# Patient Record
Sex: Female | Born: 1970
Health system: Southern US, Community
[De-identification: ages and names within clinical notes are randomized; demographics above are authoritative.]

## PROBLEM LIST (undated history)

## (undated) DIAGNOSIS — J45909 Unspecified asthma, uncomplicated: Secondary | ICD-10-CM

## (undated) DIAGNOSIS — K219 Gastro-esophageal reflux disease without esophagitis: Secondary | ICD-10-CM

## (undated) DIAGNOSIS — E119 Type 2 diabetes mellitus without complications: Secondary | ICD-10-CM

## (undated) DIAGNOSIS — E559 Vitamin D deficiency, unspecified: Secondary | ICD-10-CM

## (undated) DIAGNOSIS — J3089 Other allergic rhinitis: Secondary | ICD-10-CM

## (undated) DIAGNOSIS — I10 Essential (primary) hypertension: Secondary | ICD-10-CM

## (undated) HISTORY — PX: TUBAL LIGATION: SHX77

## (undated) HISTORY — DX: Gastro-esophageal reflux disease without esophagitis: K21.9

## (undated) HISTORY — DX: Vitamin D deficiency, unspecified: E55.9

## (undated) HISTORY — PX: BACK SURGERY: SHX140

## (undated) HISTORY — PX: APPENDECTOMY: SHX54

---

## 1998-11-21 ENCOUNTER — Other Ambulatory Visit: Admission: RE | Admit: 1998-11-21 | Discharge: 1998-11-21 | Payer: Self-pay | Admitting: Family Medicine

## 1998-11-26 ENCOUNTER — Ambulatory Visit (HOSPITAL_COMMUNITY): Admission: RE | Admit: 1998-11-26 | Discharge: 1998-11-26 | Payer: Self-pay | Admitting: Family Medicine

## 2000-10-16 ENCOUNTER — Encounter: Payer: Self-pay | Admitting: *Deleted

## 2000-10-16 ENCOUNTER — Ambulatory Visit (HOSPITAL_COMMUNITY): Admission: RE | Admit: 2000-10-16 | Discharge: 2000-10-16 | Payer: Self-pay | Admitting: *Deleted

## 2002-02-04 ENCOUNTER — Encounter: Payer: Self-pay | Admitting: Family Medicine

## 2002-02-04 ENCOUNTER — Ambulatory Visit (HOSPITAL_COMMUNITY): Admission: RE | Admit: 2002-02-04 | Discharge: 2002-02-04 | Payer: Self-pay | Admitting: Family Medicine

## 2003-09-26 ENCOUNTER — Ambulatory Visit (HOSPITAL_COMMUNITY): Admission: RE | Admit: 2003-09-26 | Discharge: 2003-09-26 | Payer: Self-pay | Admitting: Family Medicine

## 2004-03-07 ENCOUNTER — Encounter (INDEPENDENT_AMBULATORY_CARE_PROVIDER_SITE_OTHER): Payer: Self-pay | Admitting: Specialist

## 2004-03-07 ENCOUNTER — Observation Stay (HOSPITAL_COMMUNITY): Admission: EM | Admit: 2004-03-07 | Discharge: 2004-03-08 | Payer: Self-pay | Admitting: Emergency Medicine

## 2004-06-17 ENCOUNTER — Other Ambulatory Visit: Admission: RE | Admit: 2004-06-17 | Discharge: 2004-06-17 | Payer: Self-pay | Admitting: Family Medicine

## 2005-06-20 ENCOUNTER — Other Ambulatory Visit: Admission: RE | Admit: 2005-06-20 | Discharge: 2005-06-20 | Payer: Self-pay | Admitting: Family Medicine

## 2006-07-24 ENCOUNTER — Other Ambulatory Visit: Admission: RE | Admit: 2006-07-24 | Discharge: 2006-07-24 | Payer: Self-pay | Admitting: Family Medicine

## 2007-01-07 HISTORY — PX: LUMBAR DISC SURGERY: SHX700

## 2007-09-17 ENCOUNTER — Other Ambulatory Visit: Admission: RE | Admit: 2007-09-17 | Discharge: 2007-09-17 | Payer: Self-pay | Admitting: Family Medicine

## 2010-05-24 NOTE — H&P (Signed)
NAMECAILIE, BOSSHART         ACCOUNT NO.:  [id number]   MEDICAL RECORD NO.:  [id number]          PATIENT TYPE:  INP   LOCATION:  0101                         FACILITY[facility]  [facility]   PHYSICIAN:  Vikki Ports, MDDATE OF BIRTH:  01/19/1970   DATE OF ADMISSION:  03/07/2004  DATE OF DISCHARGE:                                HISTORY & PHYSICAL   ADMISSION DIAGNOSES:  Acute appendicitis.   HISTORY OF PRESENT ILLNESS:  The patient is a very pleasant 40 year old  white female who noticed periumbilical abdominal pain 24 hours prior to  presenting to the emergency room. It then localized in the right lower  quadrant and she did experience some nausea. She was seen by the emergency  room physician who ordered a CAT scan. CT was consistent with acute  appendicitis and white count was elevated at 16,000.  I was then consulted.   PAST MEDICAL HISTORY:  Significant for asthma.   PAST SURGICAL HISTORY:  Significant for bilateral tubal ligation.   MEDICATIONS:  Flonase and albuterol.   PHYSICAL EXAMINATION:  GENERAL:  She is an age appropriate, obese, white  female in no distress.  HEENT:  Benign, normocephalic, atraumatic. Pupils equal round and reactive  to light.  NECK:  Soft without thyromegaly or cervical adenopathy.  LUNGS:  Clear to auscultation and percussion x2.  HEART:  Regular rate and rhythm without murmurs, rubs or gallops.  ABDOMEN:  Obese but tender in the right lower quadrant. There are no  abdominal wall hernia defects.   IMPRESSION:  Acute appendicitis.   PLAN:  Laparoscopic appendectomy with preoperative antibiotics.      KRH/MEDQ  D:  03/07/2004  T:  03/07/2004  Job:  [id number]

## 2010-09-06 ENCOUNTER — Other Ambulatory Visit: Payer: Self-pay | Admitting: Family Medicine

## 2010-09-06 ENCOUNTER — Other Ambulatory Visit (HOSPITAL_COMMUNITY)
Admission: RE | Admit: 2010-09-06 | Discharge: 2010-09-06 | Disposition: A | Discharge status: Home / Self Care | Payer: PRIVATE HEALTH INSURANCE | Source: Ambulatory Visit | Attending: Family Medicine | Admitting: Family Medicine

## 2010-09-06 DIAGNOSIS — Z0000 Encounter for general adult medical examination without abnormal findings: Secondary | ICD-10-CM | POA: Insufficient documentation

## 2011-08-22 ENCOUNTER — Other Ambulatory Visit: Payer: Self-pay | Admitting: *Deleted

## 2013-01-14 ENCOUNTER — Other Ambulatory Visit (HOSPITAL_COMMUNITY)
Admission: RE | Admit: 2013-01-14 | Discharge: 2013-01-14 | Disposition: A | Discharge status: Home / Self Care | Payer: BC Managed Care – PPO | Source: Ambulatory Visit | Attending: Family Medicine | Admitting: Family Medicine

## 2013-01-14 ENCOUNTER — Other Ambulatory Visit: Payer: Self-pay | Admitting: Family Medicine

## 2013-01-14 DIAGNOSIS — Z0000 Encounter for general adult medical examination without abnormal findings: Secondary | ICD-10-CM | POA: Insufficient documentation

## 2013-02-15 ENCOUNTER — Other Ambulatory Visit: Payer: Self-pay

## 2013-02-15 DIAGNOSIS — Z1231 Encounter for screening mammogram for malignant neoplasm of breast: Secondary | ICD-10-CM

## 2013-02-25 ENCOUNTER — Ambulatory Visit
Admission: RE | Admit: 2013-02-25 | Discharge: 2013-02-25 | Disposition: A | Discharge status: Home / Self Care | Payer: BC Managed Care – PPO | Source: Ambulatory Visit

## 2013-02-25 DIAGNOSIS — Z1231 Encounter for screening mammogram for malignant neoplasm of breast: Secondary | ICD-10-CM

## 2013-03-01 ENCOUNTER — Other Ambulatory Visit: Payer: Self-pay | Admitting: Family Medicine

## 2013-03-01 DIAGNOSIS — R928 Other abnormal and inconclusive findings on diagnostic imaging of breast: Secondary | ICD-10-CM

## 2013-03-16 ENCOUNTER — Ambulatory Visit
Admission: RE | Admit: 2013-03-16 | Discharge: 2013-03-16 | Disposition: A | Discharge status: Home / Self Care | Payer: BC Managed Care – PPO | Source: Ambulatory Visit | Attending: Family Medicine | Admitting: Family Medicine

## 2013-03-16 ENCOUNTER — Ambulatory Visit
Admission: RE | Admit: 2013-03-16 | Discharge: 2013-03-16 | Disposition: A | Discharge status: Home / Self Care | Payer: Self-pay | Source: Ambulatory Visit | Attending: Family Medicine | Admitting: Family Medicine

## 2013-03-16 DIAGNOSIS — R928 Other abnormal and inconclusive findings on diagnostic imaging of breast: Secondary | ICD-10-CM

## 2013-09-06 ENCOUNTER — Other Ambulatory Visit: Payer: Self-pay | Admitting: Family Medicine

## 2013-09-06 DIAGNOSIS — N63 Unspecified lump in unspecified breast: Secondary | ICD-10-CM

## 2013-09-19 ENCOUNTER — Encounter (INDEPENDENT_AMBULATORY_CARE_PROVIDER_SITE_OTHER): Payer: Self-pay

## 2013-09-19 ENCOUNTER — Ambulatory Visit
Admission: RE | Admit: 2013-09-19 | Discharge: 2013-09-19 | Disposition: A | Discharge status: Home / Self Care | Payer: BC Managed Care – PPO | Source: Ambulatory Visit | Attending: Family Medicine | Admitting: Family Medicine

## 2013-09-19 DIAGNOSIS — N63 Unspecified lump in unspecified breast: Secondary | ICD-10-CM

## 2014-03-06 ENCOUNTER — Other Ambulatory Visit: Payer: Self-pay | Admitting: Family Medicine

## 2014-03-06 DIAGNOSIS — R921 Mammographic calcification found on diagnostic imaging of breast: Secondary | ICD-10-CM

## 2014-03-24 ENCOUNTER — Ambulatory Visit
Admission: RE | Admit: 2014-03-24 | Discharge: 2014-03-24 | Disposition: A | Discharge status: Home / Self Care | Payer: BLUE CROSS/BLUE SHIELD | Source: Ambulatory Visit | Attending: Family Medicine | Admitting: Family Medicine

## 2014-03-24 DIAGNOSIS — R921 Mammographic calcification found on diagnostic imaging of breast: Secondary | ICD-10-CM

## 2015-06-29 ENCOUNTER — Other Ambulatory Visit (HOSPITAL_COMMUNITY)
Admission: RE | Admit: 2015-06-29 | Discharge: 2015-06-29 | Disposition: A | Discharge status: Home / Self Care | Payer: BLUE CROSS/BLUE SHIELD | Source: Ambulatory Visit | Attending: Family Medicine | Admitting: Family Medicine

## 2015-06-29 ENCOUNTER — Other Ambulatory Visit: Payer: Self-pay | Admitting: Family Medicine

## 2015-06-29 DIAGNOSIS — Z113 Encounter for screening for infections with a predominantly sexual mode of transmission: Secondary | ICD-10-CM | POA: Insufficient documentation

## 2015-06-29 DIAGNOSIS — Z0000 Encounter for general adult medical examination without abnormal findings: Secondary | ICD-10-CM | POA: Diagnosis not present

## 2015-06-29 DIAGNOSIS — Z124 Encounter for screening for malignant neoplasm of cervix: Secondary | ICD-10-CM | POA: Insufficient documentation

## 2015-06-29 DIAGNOSIS — E559 Vitamin D deficiency, unspecified: Secondary | ICD-10-CM | POA: Diagnosis not present

## 2015-06-29 DIAGNOSIS — Z1151 Encounter for screening for human papillomavirus (HPV): Secondary | ICD-10-CM | POA: Diagnosis not present

## 2015-06-29 DIAGNOSIS — Z13220 Encounter for screening for lipoid disorders: Secondary | ICD-10-CM | POA: Diagnosis not present

## 2015-07-02 LAB — CYTOLOGY - PAP

## 2016-06-05 ENCOUNTER — Emergency Department (HOSPITAL_BASED_OUTPATIENT_CLINIC_OR_DEPARTMENT_OTHER)
Admission: EM | Admit: 2016-06-05 | Discharge: 2016-06-06 | Disposition: A | Discharge status: Home / Self Care | Payer: 59 | Attending: Emergency Medicine | Admitting: Emergency Medicine

## 2016-06-05 ENCOUNTER — Emergency Department (HOSPITAL_BASED_OUTPATIENT_CLINIC_OR_DEPARTMENT_OTHER): Payer: 59

## 2016-06-05 ENCOUNTER — Encounter (HOSPITAL_BASED_OUTPATIENT_CLINIC_OR_DEPARTMENT_OTHER): Payer: Self-pay | Admitting: Emergency Medicine

## 2016-06-05 DIAGNOSIS — R0789 Other chest pain: Secondary | ICD-10-CM

## 2016-06-05 DIAGNOSIS — J45909 Unspecified asthma, uncomplicated: Secondary | ICD-10-CM | POA: Insufficient documentation

## 2016-06-05 DIAGNOSIS — Z79899 Other long term (current) drug therapy: Secondary | ICD-10-CM | POA: Insufficient documentation

## 2016-06-05 DIAGNOSIS — R079 Chest pain, unspecified: Secondary | ICD-10-CM | POA: Diagnosis present

## 2016-06-05 HISTORY — DX: Other allergic rhinitis: J30.89

## 2016-06-05 HISTORY — DX: Unspecified asthma, uncomplicated: J45.909

## 2016-06-05 LAB — BASIC METABOLIC PANEL
Anion gap: 8 (ref 5–15)
BUN: 11 mg/dL (ref 6–20)
CHLORIDE: 102 mmol/L (ref 101–111)
CO2: 26 mmol/L (ref 22–32)
Calcium: 8.8 mg/dL — ABNORMAL LOW (ref 8.9–10.3)
Creatinine, Ser: 0.69 mg/dL (ref 0.44–1.00)
GFR calc Af Amer: >60 mL/min (ref >60)
GFR calc non Af Amer: >60 mL/min (ref >60)
GLUCOSE: 168 mg/dL — AB (ref 65–99)
POTASSIUM: 3.4 mmol/L — AB (ref 3.5–5.1)
Sodium: 136 mmol/L (ref 135–145)

## 2016-06-05 LAB — CBC
HCT: 34.6 % — ABNORMAL LOW (ref 36.0–46.0)
Hemoglobin: 11.4 g/dL — ABNORMAL LOW (ref 12.0–15.0)
MCH: 28.4 pg (ref 26.0–34.0)
MCHC: 32.9 g/dL (ref 30.0–36.0)
MCV: 86.1 fL (ref 78.0–100.0)
Platelets: 330 10*3/uL (ref 150–400)
RBC: 4.02 MIL/uL (ref 3.87–5.11)
RDW: 13.4 % (ref 11.5–15.5)
WBC: 8.5 10*3/uL (ref 4.0–10.5)

## 2016-06-05 LAB — TROPONIN I: Troponin I: <0.03 ng/mL (ref <0.03)

## 2016-06-05 LAB — D-DIMER, QUANTITATIVE: D-Dimer, Quant: 0.44 ug/mL-FEU (ref 0.00–0.50)

## 2016-06-05 MED ORDER — IPRATROPIUM-ALBUTEROL 0.5-2.5 (3) MG/3ML IN SOLN
3.0000 mL | Freq: Once | RESPIRATORY_TRACT | Status: AC
  Administered 2016-06-05: 3 mL via RESPIRATORY_TRACT

## 2016-06-05 MED ORDER — ALBUTEROL SULFATE (2.5 MG/3ML) 0.083% IN NEBU
2.5000 mg | INHALATION_SOLUTION | Freq: Once | RESPIRATORY_TRACT | Status: AC
  Administered 2016-06-05: 2.5 mg via RESPIRATORY_TRACT

## 2016-06-05 NOTE — ED Provider Notes (Signed)
[facility] Provider Note   CSN: [id number] Arrival date & time: 06/05/16  2039   By signing my name below, I, [name], attest that this documentation has been prepared under the direction and in the presence of Melburn Hake, [place]. Electronically Signed: [name], Scribe. 06/05/16. 11:21 PM.   History   Chief Complaint Chief Complaint  Patient presents with  . Chest Pain   The history is provided by the patient and medical records. No language interpreter was used.    Karen Mendoza is a 46 y.o. female with h/o asthma and seasonal allergies presenting to the Emergency Department with chief complaint of central chest pain spreading across the chest x 3 days. She describes 2/10, dull, pressure like, non radiaiting pain that becomes sharp and slightly more intense when taking deep breaths. Mild nonproductive cough and wheezing also noted. She adds her pain has improved Since arrival to the ED. She further states she used her inhaler last ~3 Pm today without relief to pain. Pt allegedly takes zyrtec and benadryl for allergies daily. No h/o HTN, HLD, DM or CA. No personal or family history of cardiac disease. No recent surgery, hospitalizations, immobilizations, recent long distance travel or hormone therapies noted. No fever, rhinorrhea, congestion, significant SOB, palpitations, abdominal pain, N/V, numbness, weakness or leg swelling noted. No other complaints at this time.   Past Medical History:  Diagnosis Date  . Asthma   . Environmental and seasonal allergies     There are no active problems to display for this patient.   Past Surgical History:  Procedure Laterality Date  . APPENDECTOMY      OB History    No data available       Home Medications    Prior to Admission medications   Medication Sig Start Date End Date Taking? Authorizing Provider  cetirizine (ZYRTEC) 10 MG tablet Take 10 mg by mouth daily.   Yes [provider]    omeprazole (PRILOSEC) 20 MG capsule Take 20 mg by mouth daily.   Yes [provider]    Family History History reviewed. No pertinent family history.  Social History Social History  Substance Use Topics  . Smoking status: Never Smoker  . Smokeless tobacco: Never Used  . Alcohol use No     Allergies   Indomethacin   Review of Systems Review of Systems  Constitutional: Negative for fever.  HENT: Negative for congestion and rhinorrhea.   Respiratory: Positive for cough, chest tightness and wheezing. Negative for shortness of breath.   Cardiovascular: Positive for chest pain. Negative for palpitations and leg swelling.  Gastrointestinal: Negative for abdominal pain, nausea and vomiting.  Allergic/Immunologic: Positive for environmental allergies. Negative for immunocompromised state.  Neurological: Negative for weakness and numbness.     Physical Exam Updated Vital Signs BP (!) 141/73   Pulse 96   Temp 97.9 F (36.6 C) (Oral)   Resp 18   Ht 5\' 10"  (1.778 m)   Wt 300 lb (136.1 kg)   SpO2 98%   BMI 43.05 kg/m   Physical Exam  Constitutional: She is oriented to person, place, and time. She appears well-developed and well-nourished. No distress.  Morbidly obese female  HENT:  Head: Normocephalic and atraumatic.  Mouth/Throat: Uvula is midline, oropharynx is clear and moist and mucous membranes are normal. No oropharyngeal exudate, posterior oropharyngeal edema, posterior oropharyngeal erythema or tonsillar abscesses. No tonsillar exudate.  Eyes: Conjunctivae and EOM are normal. Pupils are equal, round, and reactive  to light. Right eye exhibits no discharge. Left eye exhibits no discharge. No scleral icterus.  Neck: Normal range of motion. Neck supple.  Cardiovascular: Normal rate, regular rhythm, normal heart sounds and intact distal pulses.   Pulmonary/Chest: Effort normal and breath sounds normal. No respiratory distress. She has no wheezes. She has no rales.  She exhibits no tenderness.  Abdominal: Soft. Bowel sounds are normal. She exhibits no distension and no mass. There is no tenderness. There is no rebound and no guarding. No hernia.  Musculoskeletal: Normal range of motion. She exhibits no edema.  Neurological: She is alert and oriented to person, place, and time.  Skin: Skin is warm and dry. She is not diaphoretic.  Nursing note and vitals reviewed.    ED Treatments / Results  DIAGNOSTIC STUDIES: Oxygen Saturation is 98% on RA, NL by my interpretation.    COORDINATION OF CARE: 10:54 PM-Discussed next steps with pt. Pt verbalized understanding and is agreeable with the plan. Will order EKG.   Labs (all labs ordered are listed, but only abnormal results are displayed) Labs Reviewed  BASIC METABOLIC PANEL - Abnormal; Notable for the following:       Result Value   Potassium 3.4 (*)    Glucose, Bld 168 (*)    Calcium 8.8 (*)    All other components within normal limits  CBC - Abnormal; Notable for the following:    Hemoglobin 11.4 (*)    HCT 34.6 (*)    All other components within normal limits  TROPONIN I  D-DIMER, QUANTITATIVE (NOT AT [facility])    EKG  EKG Interpretation  Date/Time:  Thursday Jun 05 2016 20:52:08 EDT Ventricular Rate:  92 PR Interval:  162 QRS Duration: 104 QT Interval:  368 QTC Calculation: 455 R Axis:   -12 Text Interpretation:  Normal sinus rhythm Left ventricular hypertrophy Cannot rule out Septal infarct , age undetermined Abnormal ECG No prior for comparison.  no STEMI Confirmed by Theda Belfast ([id number]) on 06/05/2016 11:27:28 PM       Radiology Dg Chest 2 View  Result Date: 06/05/2016 CLINICAL DATA:  Shortness of breath with chest pain for 3 days EXAM: CHEST  2 VIEW COMPARISON:  None. FINDINGS: No focal pulmonary infiltrate, consolidation or effusion. Minimal bibasilar atelectasis. Borderline cardiomegaly. No pneumothorax. IMPRESSION: Borderline cardiomegaly with minimal bibasilar atelectasis. No  infiltrate or edema. Electronically Signed   By: Jasmine Pang M.D.   On: 06/05/2016 21:12    Procedures Procedures (including critical care time)  Medications Ordered in ED Medications  albuterol (PROVENTIL) (2.5 MG/3ML) 0.083% nebulizer solution 2.5 mg (2.5 mg Nebulization Given 06/05/16 2108)  ipratropium-albuterol (DUONEB) 0.5-2.5 (3) MG/3ML nebulizer solution 3 mL (3 mLs Nebulization Given 06/05/16 2108)     Initial Impression / Assessment and Plan / ED Course  I have reviewed the triage vital signs and the nursing notes.  Pertinent labs & imaging results that were available during my care of the patient were reviewed by me and considered in my medical decision making (see chart for details).     Patient presents with constant cardiac chest pain has impressive for the past 3 days. Denies fever, shortness of breath. History of asthma. Denies personal or family history of cardiac disease. VSS. Exam unremarkable, lungs clear to auscultation bilaterally. EKG showed sinus rhythm with no acute ischemic changes. Troponin negative. Labs unremarkable. Chest x-ray negative. PERC negative. HEART score 1. Discussed pt with Dr. Rush Landmark. Due to patient with history of pleuritic chest pain will  order d-dimer for further evaluation of chest pain due to concern for PE. D-dimer negative. On reevaluation patient is sitting resting comfortably in bed. I have a low suspicion for ACS, PE, dissection, or other acute cardiac event at this time. Suspect patient's symptoms may be related to her history of asthma. Plan to discharge patient home with symptomatic treatment and advised to continue using her albuterol inhaler at home. Advised patient to follow up with her PCP. Discussed return precautions.    Final Clinical Impressions(s) / ED Diagnoses   Final diagnoses:  Atypical chest pain    New Prescriptions New Prescriptions   No medications on file   I personally performed the services described in this  documentation, which was scribed in my presence. The recorded information has been reviewed and is accurate.    Barrett Henleadeau, Nicole Elizabeth, PA-C 06/06/16 0012    Tegeler, Canary Brimhristopher J, MD 06/06/16 [id number]

## 2016-06-05 NOTE — ED Notes (Signed)
Pt reports chest pain upon breathing for 3 days, denies any radiation of pain. Pt points to the center of her chest as to where the pain is at.

## 2016-06-05 NOTE — ED Notes (Signed)
ED Provider at bedside. 

## 2016-06-05 NOTE — ED Triage Notes (Signed)
Patient states that she had had pain to her chest for the last 2 -3 days. Reports that today it had been steady and she is now having SOB - concerned because she has asthma

## 2016-06-06 NOTE — Discharge Instructions (Signed)
Continue taking her medications as prescribed. I recommend continuing to use her albuterol as needed for shortness of breath or wheezing. Follow-up with your primary care provider in the next 3-4 days for follow-up evaluation regarding your chest pain. Return to the emergency department if symptoms worsen or new onset of fever, dizziness, new/worsening chest pain, difficulty breathing, coughing up blood, abdominal pain, vomiting, numbness, weakness, syncope.

## 2016-07-11 ENCOUNTER — Other Ambulatory Visit: Payer: Self-pay | Admitting: Family Medicine

## 2016-07-11 DIAGNOSIS — Z79899 Other long term (current) drug therapy: Secondary | ICD-10-CM | POA: Diagnosis not present

## 2016-07-11 DIAGNOSIS — N938 Other specified abnormal uterine and vaginal bleeding: Secondary | ICD-10-CM

## 2016-07-11 DIAGNOSIS — R079 Chest pain, unspecified: Secondary | ICD-10-CM | POA: Diagnosis not present

## 2016-07-11 DIAGNOSIS — K219 Gastro-esophageal reflux disease without esophagitis: Secondary | ICD-10-CM | POA: Diagnosis not present

## 2016-07-11 DIAGNOSIS — Z13220 Encounter for screening for lipoid disorders: Secondary | ICD-10-CM | POA: Diagnosis not present

## 2016-07-11 DIAGNOSIS — Z0000 Encounter for general adult medical examination without abnormal findings: Secondary | ICD-10-CM | POA: Diagnosis not present

## 2016-07-11 DIAGNOSIS — E559 Vitamin D deficiency, unspecified: Secondary | ICD-10-CM | POA: Diagnosis not present

## 2016-07-11 DIAGNOSIS — Z1231 Encounter for screening mammogram for malignant neoplasm of breast: Secondary | ICD-10-CM

## 2016-07-18 ENCOUNTER — Ambulatory Visit
Admission: RE | Admit: 2016-07-18 | Discharge: 2016-07-18 | Disposition: A | Discharge status: Home / Self Care | Payer: BLUE CROSS/BLUE SHIELD | Source: Ambulatory Visit | Attending: Family Medicine | Admitting: Family Medicine

## 2016-07-18 DIAGNOSIS — Z1231 Encounter for screening mammogram for malignant neoplasm of breast: Secondary | ICD-10-CM | POA: Diagnosis not present

## 2016-07-25 ENCOUNTER — Ambulatory Visit
Admission: RE | Admit: 2016-07-25 | Discharge: 2016-07-25 | Disposition: A | Discharge status: Home / Self Care | Payer: BLUE CROSS/BLUE SHIELD | Source: Ambulatory Visit | Attending: Family Medicine | Admitting: Family Medicine

## 2016-07-25 DIAGNOSIS — N939 Abnormal uterine and vaginal bleeding, unspecified: Secondary | ICD-10-CM | POA: Diagnosis not present

## 2016-07-25 DIAGNOSIS — N938 Other specified abnormal uterine and vaginal bleeding: Secondary | ICD-10-CM

## 2016-07-31 DIAGNOSIS — J029 Acute pharyngitis, unspecified: Secondary | ICD-10-CM | POA: Diagnosis not present

## 2017-01-30 DIAGNOSIS — L03221 Cellulitis of neck: Secondary | ICD-10-CM | POA: Diagnosis not present

## 2017-02-10 DIAGNOSIS — L03221 Cellulitis of neck: Secondary | ICD-10-CM | POA: Diagnosis not present

## 2017-03-13 DIAGNOSIS — L508 Other urticaria: Secondary | ICD-10-CM | POA: Diagnosis not present

## 2017-03-31 ENCOUNTER — Emergency Department (HOSPITAL_BASED_OUTPATIENT_CLINIC_OR_DEPARTMENT_OTHER): Payer: BLUE CROSS/BLUE SHIELD

## 2017-03-31 ENCOUNTER — Encounter (HOSPITAL_BASED_OUTPATIENT_CLINIC_OR_DEPARTMENT_OTHER): Payer: Self-pay | Admitting: *Deleted

## 2017-03-31 ENCOUNTER — Encounter: Payer: Self-pay | Admitting: Surgery

## 2017-03-31 ENCOUNTER — Observation Stay (HOSPITAL_BASED_OUTPATIENT_CLINIC_OR_DEPARTMENT_OTHER)
Admission: EM | Admit: 2017-03-31 | Discharge: 2017-04-02 | Disposition: A | Discharge status: Home / Self Care | Payer: BLUE CROSS/BLUE SHIELD | Attending: General Surgery | Admitting: General Surgery

## 2017-03-31 ENCOUNTER — Other Ambulatory Visit: Payer: Self-pay

## 2017-03-31 DIAGNOSIS — K8010 Calculus of gallbladder with chronic cholecystitis without obstruction: Secondary | ICD-10-CM | POA: Diagnosis not present

## 2017-03-31 DIAGNOSIS — K8020 Calculus of gallbladder without cholecystitis without obstruction: Secondary | ICD-10-CM | POA: Diagnosis not present

## 2017-03-31 DIAGNOSIS — K819 Cholecystitis, unspecified: Secondary | ICD-10-CM | POA: Diagnosis not present

## 2017-03-31 DIAGNOSIS — K810 Acute cholecystitis: Secondary | ICD-10-CM | POA: Diagnosis present

## 2017-03-31 DIAGNOSIS — K219 Gastro-esophageal reflux disease without esophagitis: Secondary | ICD-10-CM | POA: Insufficient documentation

## 2017-03-31 DIAGNOSIS — R1011 Right upper quadrant pain: Secondary | ICD-10-CM | POA: Diagnosis not present

## 2017-03-31 DIAGNOSIS — R110 Nausea: Secondary | ICD-10-CM | POA: Diagnosis not present

## 2017-03-31 DIAGNOSIS — Z419 Encounter for procedure for purposes other than remedying health state, unspecified: Secondary | ICD-10-CM

## 2017-03-31 DIAGNOSIS — Z6841 Body Mass Index (BMI) 40.0 and over, adult: Secondary | ICD-10-CM | POA: Diagnosis not present

## 2017-03-31 DIAGNOSIS — R109 Unspecified abdominal pain: Secondary | ICD-10-CM | POA: Diagnosis present

## 2017-03-31 LAB — COMPREHENSIVE METABOLIC PANEL
ALBUMIN: 3.8 g/dL (ref 3.5–5.0)
ALK PHOS: 55 U/L (ref 38–126)
ALT: 9 U/L — ABNORMAL LOW (ref 14–54)
AST: 19 U/L (ref 15–41)
Anion gap: 11 (ref 5–15)
BILIRUBIN TOTAL: 0.5 mg/dL (ref 0.3–1.2)
BUN: 11 mg/dL (ref 6–20)
CO2: 24 mmol/L (ref 22–32)
Calcium: 9 mg/dL (ref 8.9–10.3)
Chloride: 99 mmol/L — ABNORMAL LOW (ref 101–111)
Creatinine, Ser: 0.74 mg/dL (ref 0.44–1.00)
GFR calc Af Amer: >60 mL/min (ref >60)
GFR calc non Af Amer: >60 mL/min (ref >60)
GLUCOSE: 119 mg/dL — AB (ref 65–99)
POTASSIUM: 3.6 mmol/L (ref 3.5–5.1)
Sodium: 134 mmol/L — ABNORMAL LOW (ref 135–145)
TOTAL PROTEIN: 7.4 g/dL (ref 6.5–8.1)

## 2017-03-31 LAB — URINALYSIS, ROUTINE W REFLEX MICROSCOPIC
Bilirubin Urine: NEGATIVE
Glucose, UA: NEGATIVE mg/dL
HGB URINE DIPSTICK: NEGATIVE
Ketones, ur: NEGATIVE mg/dL
Leukocytes, UA: NEGATIVE
Nitrite: NEGATIVE
PH: 6 (ref 5.0–8.0)
Protein, ur: NEGATIVE mg/dL
Specific Gravity, Urine: >1.03 — ABNORMAL HIGH (ref 1.005–1.030)

## 2017-03-31 LAB — CBC
HEMATOCRIT: 34.2 % — AB (ref 36.0–46.0)
HEMOGLOBIN: 11.3 g/dL — AB (ref 12.0–15.0)
MCH: 26.8 pg (ref 26.0–34.0)
MCHC: 33 g/dL (ref 30.0–36.0)
MCV: 81 fL (ref 78.0–100.0)
Platelets: 345 10*3/uL (ref 150–400)
RBC: 4.22 MIL/uL (ref 3.87–5.11)
RDW: 14.8 % (ref 11.5–15.5)
WBC: 13.6 10*3/uL — ABNORMAL HIGH (ref 4.0–10.5)

## 2017-03-31 LAB — PREGNANCY, URINE: Preg Test, Ur: NEGATIVE

## 2017-03-31 LAB — LIPASE, BLOOD: Lipase: 21 U/L (ref 11–51)

## 2017-03-31 MED ORDER — HYDROMORPHONE HCL 1 MG/ML IJ SOLN
1.0000 mg | Freq: Once | INTRAMUSCULAR | Status: AC
  Administered 2017-03-31: 1 mg via INTRAVENOUS
  Filled 2017-03-31: qty 1

## 2017-03-31 MED ORDER — MORPHINE SULFATE (PF) 4 MG/ML IV SOLN
4.0000 mg | INTRAVENOUS | Status: AC | PRN
  Administered 2017-03-31 – 2017-04-01 (×3): 4 mg via INTRAVENOUS
  Filled 2017-03-31 (×3): qty 1

## 2017-03-31 MED ORDER — PROMETHAZINE HCL 25 MG/ML IJ SOLN
12.5000 mg | Freq: Once | INTRAMUSCULAR | Status: AC
  Administered 2017-03-31: 12.5 mg via INTRAVENOUS
  Filled 2017-03-31: qty 1

## 2017-03-31 MED ORDER — ONDANSETRON HCL 4 MG/2ML IJ SOLN
INTRAMUSCULAR | Status: AC
  Filled 2017-03-31: qty 4

## 2017-03-31 MED ORDER — ONDANSETRON HCL 4 MG/2ML IJ SOLN
4.0000 mg | Freq: Once | INTRAMUSCULAR | Status: AC
  Administered 2017-03-31: 4 mg via INTRAVENOUS
  Filled 2017-03-31: qty 2

## 2017-03-31 MED ORDER — SODIUM CHLORIDE 0.9 % IV SOLN
8.0000 mg | Freq: Once | INTRAVENOUS | Status: AC
  Administered 2017-03-31: 8 mg via INTRAVENOUS
  Filled 2017-03-31: qty 4

## 2017-03-31 NOTE — ED Notes (Signed)
Pt still very nauseated and c/o 10/10 abd pain, EDP to be notified.

## 2017-03-31 NOTE — ED Notes (Signed)
Pt on US.

## 2017-03-31 NOTE — ED Notes (Signed)
Attempt made to get IV and labs with no success another team will try.

## 2017-03-31 NOTE — ED Notes (Signed)
Martin, MD at bedside.

## 2017-03-31 NOTE — ED Provider Notes (Signed)
[facility] EMERGENCY DEPARTMENT Provider Note   CSN: [id number] Arrival date & time: 03/31/17  1854     History   Chief Complaint Chief Complaint  Patient presents with  . Abdominal Pain    HPI Karen Mendoza is a 47 y.o. female.  Chief complaint is abdominal pain.  HPI: Karen Mendoza is 33.  She has no history of biliary disease.  No history of food intolerances.  Today she ate a [employer] sandwich.  Approximately half an hour later while sitting in her desk at work she developed severe right upper quadrant pain radiating to the tip of her scapula.  Nausea but no vomiting.  No fever.  States she feels restless.  Her pain is not improved.  No recent dark urine or light stools.  No heavy alcohol use.  No history of hepatitis, gallstones, liver disease.  No alcohol use.  Non-smoker.  No excessive anti-inflammatories.  Occasional caffeine  Past Medical History:  Diagnosis Date  . Asthma   . Environmental and seasonal allergies     There are no active problems to display for this patient.   Past Surgical History:  Procedure Laterality Date  . APPENDECTOMY    . BACK SURGERY       OB History   None      Home Medications    Prior to Admission medications   Medication Sig Start Date End Date Taking? Authorizing Provider  cetirizine (ZYRTEC) 10 MG tablet Take 10 mg by mouth daily.    [provider]  omeprazole (PRILOSEC) 20 MG capsule Take 20 mg by mouth daily.    [provider]    Family History No family history on file.  Social History Social History   Tobacco Use  . Smoking status: Never Smoker  . Smokeless tobacco: Never Used  Substance Use Topics  . Alcohol use: No  . Drug use: No     Allergies   Indomethacin   Review of Systems Review of Systems  Constitutional: Negative for appetite change, chills, diaphoresis, fatigue and fever.  HENT: Negative for mouth sores, sore throat and trouble swallowing.   Eyes:  Negative for visual disturbance.  Respiratory: Negative for cough, chest tightness, shortness of breath and wheezing.   Cardiovascular: Negative for chest pain.  Gastrointestinal: Positive for abdominal pain and nausea. Negative for abdominal distention, diarrhea and vomiting.  Endocrine: Negative for polydipsia, polyphagia and polyuria.  Genitourinary: Negative for dysuria, frequency and hematuria.  Musculoskeletal: Negative for gait problem.  Skin: Negative for color change, pallor and rash.  Neurological: Negative for dizziness, syncope, light-headedness and headaches.  Hematological: Does not bruise/bleed easily.  Psychiatric/Behavioral: Negative for behavioral problems and confusion.     Physical Exam Updated Vital Signs BP (!) 150/71 (BP Location: Right Arm)   Pulse 74   Temp 98.9 F (37.2 C) (Oral)   Resp 20   Ht 5\' 10"  (1.778 m)   Wt 136.1 kg (300 lb)   LMP 03/17/2017   SpO2 100%   BMI 43.05 kg/m   Physical Exam  Constitutional: She is oriented to person, place, and time. No distress.  Appears uncomfortable.  Shifting back and forth in bed  HENT:  Head: Normocephalic.  Eyes: Pupils are equal, round, and reactive to light. Conjunctivae are normal. No scleral icterus.  Neck: Normal range of motion. Neck supple. No thyromegaly present.  Cardiovascular: Normal rate and regular rhythm. Exam reveals no gallop and no friction rub.  No murmur heard. Pulmonary/Chest: Effort normal  and breath sounds normal. No respiratory distress. She has no wheezes. She has no rales.  Abdominal: Soft. Bowel sounds are normal. She exhibits no distension. There is tenderness in the right upper quadrant. There is rebound and positive Murphy's sign.  Musculoskeletal: Normal range of motion.  Neurological: She is alert and oriented to person, place, and time.  Skin: Skin is warm and dry. No rash noted.  Psychiatric: She has a normal mood and affect. Her behavior is normal.     ED Treatments /  Results  Labs (all labs ordered are listed, but only abnormal results are displayed) Labs Reviewed  URINALYSIS, ROUTINE W REFLEX MICROSCOPIC - Abnormal; Notable for the following components:      Result Value   Specific Gravity, Urine >1.030 (*)    All other components within normal limits  COMPREHENSIVE METABOLIC PANEL - Abnormal; Notable for the following components:   Sodium 134 (*)    Chloride 99 (*)    Glucose, Bld 119 (*)    ALT 9 (*)    All other components within normal limits  CBC - Abnormal; Notable for the following components:   WBC 13.6 (*)    Hemoglobin 11.3 (*)    HCT 34.2 (*)    All other components within normal limits  PREGNANCY, URINE  LIPASE, BLOOD    EKG None  Radiology US Abdomen Complete  Result Date: 03/31/2017 CLINICAL DATA:  Initial evaluation for acute right upper quadrant pain, positive Murphy's sign. EXAM: ABDOMEN ULTRASOUND COMPLETE COMPARISON:  None. FINDINGS: Gallbladder: Gallbladder is distended measuring up to approximately 10 cm. Internal stones and sludge present within the gallbladder lumen. Gallbladder wall mildly thickened up to 4 mm with small amount of free pericholecystic fluid. Sonographic Eulah Pont sign was elicited on exam. Common bile duct: Diameter: Not visualized. Liver: No focal lesion identified. Increased echogenicity, suggesting steatosis. Portal vein is patent on color Doppler imaging with normal direction of blood flow towards the liver. IVC: Not visualized. Pancreas: Poorly visualized due to body habitus. Spleen: Size and appearance within normal limits. Right Kidney: Length: 12.1 cm. Echogenicity within normal limits. No mass or hydronephrosis visualized. Left Kidney: Length: 14.4 cm. Echogenicity within normal limits. No mass or hydronephrosis visualized. Abdominal aorta: No aneurysm visualized. Other findings: None. IMPRESSION: 1. Distended gallbladder with internal stones and sludge, mild gallbladder wall thickening, with positive  sonographic Murphy sign. Clinical correlation for possible acute cholecystitis recommended. 2. Evaluation of the biliary tree limited on this exam due to body habitus. Electronically Signed   By: Rise Mu M.D.   On: 03/31/2017 21:53    Procedures Procedures (including critical care time)  Medications Ordered in ED Medications  morphine 4 MG/ML injection 4 mg (4 mg Intravenous Given 03/31/17 2046)  ondansetron (ZOFRAN) injection 4 mg (4 mg Intravenous Given 03/31/17 2046)  promethazine (PHENERGAN) injection 12.5 mg (12.5 mg Intravenous Given 03/31/17 2205)  HYDROmorphone (DILAUDID) injection 1 mg (1 mg Intravenous Given 03/31/17 2205)     Initial Impression / Assessment and Plan / ED Course  I have reviewed the triage vital signs and the nursing notes.  Pertinent labs & imaging results that were available during my care of the patient were reviewed by me and considered in my medical decision making (see chart for details).   IV was placed.  Urine is acellular, no hematuria.  Elevated white blood cell count, 13,000.  Normal hepatobiliary enzymes.  Bladder ultrasound shows thickened wall.  Pericholecystic fluid.  Multiple gallstones.  Positive sonographic Eulah Pont  sign.  The case with Dr. Daphine DeutscherMartin.  He is a Systems analystsurgeon tonight at [employer].  He was in the operating room.  I relayed this information to him via a surrogate staff in the emergency room via phone.  He request the [facility].  Final Clinical Impressions(s) / ED Diagnoses   Final diagnoses:  RUQ abdominal pain  Cholecystitis    ED Discharge Orders    None       Rolland PorterJames, Vergil Burby, MD 03/31/17 2240

## 2017-03-31 NOTE — ED Triage Notes (Signed)
Abdominal pain since this afternoon. Pain in her right upper quadrant into her right flank. Nausea. Restless.

## 2017-03-31 NOTE — ED Provider Notes (Signed)
Dr Daphine DeutscherMartin is here in the ED and is going to see patient.    Devoria AlbeKnapp, Tracey Stewart, MD 03/31/17 [phone]

## 2017-04-01 ENCOUNTER — Observation Stay (HOSPITAL_COMMUNITY): Payer: BLUE CROSS/BLUE SHIELD

## 2017-04-01 ENCOUNTER — Observation Stay (HOSPITAL_COMMUNITY): Payer: BLUE CROSS/BLUE SHIELD | Admitting: Anesthesiology

## 2017-04-01 ENCOUNTER — Encounter (HOSPITAL_COMMUNITY): Payer: Self-pay | Admitting: Anesthesiology

## 2017-04-01 ENCOUNTER — Encounter (HOSPITAL_COMMUNITY)
Admission: EM | Disposition: A | Discharge status: Home / Self Care | Payer: Self-pay | Source: Home / Self Care | Attending: Emergency Medicine

## 2017-04-01 DIAGNOSIS — K8020 Calculus of gallbladder without cholecystitis without obstruction: Secondary | ICD-10-CM | POA: Diagnosis not present

## 2017-04-01 DIAGNOSIS — K8010 Calculus of gallbladder with chronic cholecystitis without obstruction: Secondary | ICD-10-CM | POA: Diagnosis not present

## 2017-04-01 DIAGNOSIS — K810 Acute cholecystitis: Secondary | ICD-10-CM | POA: Diagnosis present

## 2017-04-01 HISTORY — PX: CHOLECYSTECTOMY: SHX55

## 2017-04-01 LAB — HIV ANTIBODY (ROUTINE TESTING W REFLEX): HIV Screen 4th Generation wRfx: NONREACTIVE

## 2017-04-01 LAB — COMPREHENSIVE METABOLIC PANEL
ALT: 10 U/L — ABNORMAL LOW (ref 14–54)
AST: 21 U/L (ref 15–41)
Albumin: 3.7 g/dL (ref 3.5–5.0)
Alkaline Phosphatase: 56 U/L (ref 38–126)
Anion gap: 9 (ref 5–15)
BILIRUBIN TOTAL: 0.4 mg/dL (ref 0.3–1.2)
BUN: 11 mg/dL (ref 6–20)
CO2: 26 mmol/L (ref 22–32)
Calcium: 8.9 mg/dL (ref 8.9–10.3)
Chloride: 100 mmol/L — ABNORMAL LOW (ref 101–111)
Creatinine, Ser: 0.67 mg/dL (ref 0.44–1.00)
GFR calc Af Amer: >60 mL/min (ref >60)
Glucose, Bld: 127 mg/dL — ABNORMAL HIGH (ref 65–99)
POTASSIUM: 4 mmol/L (ref 3.5–5.1)
Sodium: 135 mmol/L (ref 135–145)
TOTAL PROTEIN: 7.3 g/dL (ref 6.5–8.1)

## 2017-04-01 LAB — CBC
HEMATOCRIT: 35.5 % — AB (ref 36.0–46.0)
Hemoglobin: 11 g/dL — ABNORMAL LOW (ref 12.0–15.0)
MCH: 26 pg (ref 26.0–34.0)
MCHC: 31 g/dL (ref 30.0–36.0)
MCV: 83.9 fL (ref 78.0–100.0)
Platelets: 356 10*3/uL (ref 150–400)
RBC: 4.23 MIL/uL (ref 3.87–5.11)
RDW: 14.9 % (ref 11.5–15.5)
WBC: 13.2 10*3/uL — ABNORMAL HIGH (ref 4.0–10.5)

## 2017-04-01 LAB — SURGICAL PCR SCREEN
MRSA, PCR: NEGATIVE
Staphylococcus aureus: NEGATIVE

## 2017-04-01 SURGERY — LAPAROSCOPIC CHOLECYSTECTOMY WITH INTRAOPERATIVE CHOLANGIOGRAM
Anesthesia: General

## 2017-04-01 MED ORDER — MIDAZOLAM HCL 2 MG/2ML IJ SOLN
INTRAMUSCULAR | Status: AC
  Filled 2017-04-01: qty 2

## 2017-04-01 MED ORDER — DEXAMETHASONE SODIUM PHOSPHATE 10 MG/ML IJ SOLN
INTRAMUSCULAR | Status: DC | PRN
  Administered 2017-04-01: 10 mg via INTRAVENOUS

## 2017-04-01 MED ORDER — ALBUTEROL SULFATE HFA 108 (90 BASE) MCG/ACT IN AERS
INHALATION_SPRAY | RESPIRATORY_TRACT | Status: AC
  Filled 2017-04-01: qty 6.7

## 2017-04-01 MED ORDER — METOCLOPRAMIDE HCL 5 MG/ML IJ SOLN: 10.0000 mg | Freq: Once | INTRAMUSCULAR | Status: DC | PRN

## 2017-04-01 MED ORDER — SUCCINYLCHOLINE CHLORIDE 200 MG/10ML IV SOSY
PREFILLED_SYRINGE | INTRAVENOUS | Status: AC
  Filled 2017-04-01: qty 10

## 2017-04-01 MED ORDER — HYDROMORPHONE HCL 1 MG/ML IJ SOLN
0.2500 mg | INTRAMUSCULAR | Status: DC | PRN
  Administered 2017-04-01 (×2): 0.5 mg via INTRAVENOUS

## 2017-04-01 MED ORDER — ROCURONIUM BROMIDE 10 MG/ML (PF) SYRINGE
PREFILLED_SYRINGE | INTRAVENOUS | Status: DC | PRN
  Administered 2017-04-01: 10 mg via INTRAVENOUS
  Administered 2017-04-01 (×2): 20 mg via INTRAVENOUS
  Administered 2017-04-01: 50 mg via INTRAVENOUS

## 2017-04-01 MED ORDER — LACTATED RINGERS IV SOLN
INTRAVENOUS | Status: DC
  Administered 2017-04-01 (×2): via INTRAVENOUS

## 2017-04-01 MED ORDER — HYDROMORPHONE HCL 1 MG/ML IJ SOLN: 1.0000 mg | INTRAMUSCULAR | Status: DC | PRN

## 2017-04-01 MED ORDER — PHENYLEPHRINE 40 MCG/ML (10ML) SYRINGE FOR IV PUSH (FOR BLOOD PRESSURE SUPPORT)
PREFILLED_SYRINGE | INTRAVENOUS | Status: DC | PRN
  Administered 2017-04-01: 80 ug via INTRAVENOUS

## 2017-04-01 MED ORDER — HEPARIN SODIUM (PORCINE) 5000 UNIT/ML IJ SOLN
5000.0000 [IU] | Freq: Three times a day (TID) | INTRAMUSCULAR | Status: DC
  Administered 2017-04-01: 5000 [IU] via SUBCUTANEOUS
  Filled 2017-04-01: qty 1

## 2017-04-01 MED ORDER — LIDOCAINE 2% (20 MG/ML) 5 ML SYRINGE
INTRAMUSCULAR | Status: AC
  Filled 2017-04-01: qty 5

## 2017-04-01 MED ORDER — SCOPOLAMINE 1 MG/3DAYS TD PT72
MEDICATED_PATCH | TRANSDERMAL | Status: AC
  Filled 2017-04-01: qty 1

## 2017-04-01 MED ORDER — PROPOFOL 10 MG/ML IV BOLUS
INTRAVENOUS | Status: DC | PRN
  Administered 2017-04-01: 300 mg via INTRAVENOUS

## 2017-04-01 MED ORDER — SODIUM CHLORIDE 0.9 % IJ SOLN
INTRAMUSCULAR | Status: AC
  Filled 2017-04-01: qty 50

## 2017-04-01 MED ORDER — ROCURONIUM BROMIDE 10 MG/ML (PF) SYRINGE
PREFILLED_SYRINGE | INTRAVENOUS | Status: AC
  Filled 2017-04-01: qty 5

## 2017-04-01 MED ORDER — GABAPENTIN 300 MG PO CAPS
300.0000 mg | ORAL_CAPSULE | Freq: Once | ORAL | Status: AC
  Administered 2017-04-01: 300 mg via ORAL
  Filled 2017-04-01: qty 1

## 2017-04-01 MED ORDER — ONDANSETRON HCL 4 MG/2ML IJ SOLN: 4.0000 mg | Freq: Four times a day (QID) | INTRAMUSCULAR | Status: DC | PRN

## 2017-04-01 MED ORDER — PANTOPRAZOLE SODIUM 40 MG IV SOLR
40.0000 mg | Freq: Every day | INTRAVENOUS | Status: DC
  Administered 2017-04-01: 40 mg via INTRAVENOUS
  Filled 2017-04-01: qty 40

## 2017-04-01 MED ORDER — GABAPENTIN 300 MG PO CAPS
300.0000 mg | ORAL_CAPSULE | Freq: Two times a day (BID) | ORAL | Status: DC
  Administered 2017-04-01 – 2017-04-02 (×2): 300 mg via ORAL
  Filled 2017-04-01 (×2): qty 1

## 2017-04-01 MED ORDER — KCL IN DEXTROSE-NACL 20-5-0.45 MEQ/L-%-% IV SOLN
INTRAVENOUS | Status: DC
  Administered 2017-04-01: 1000 mL via INTRAVENOUS
  Filled 2017-04-01 (×2): qty 1000

## 2017-04-01 MED ORDER — ONDANSETRON HCL 4 MG/2ML IJ SOLN
INTRAMUSCULAR | Status: AC
  Filled 2017-04-01: qty 2

## 2017-04-01 MED ORDER — HYDRALAZINE HCL 20 MG/ML IJ SOLN: 10.0000 mg | INTRAMUSCULAR | Status: DC | PRN

## 2017-04-01 MED ORDER — FENTANYL CITRATE (PF) 100 MCG/2ML IJ SOLN
INTRAMUSCULAR | Status: AC
  Filled 2017-04-01: qty 2

## 2017-04-01 MED ORDER — SUGAMMADEX SODIUM 500 MG/5ML IV SOLN
INTRAVENOUS | Status: DC | PRN
  Administered 2017-04-01: 300 mg via INTRAVENOUS

## 2017-04-01 MED ORDER — HEPARIN SODIUM (PORCINE) 5000 UNIT/ML IJ SOLN
5000.0000 [IU] | Freq: Three times a day (TID) | INTRAMUSCULAR | Status: DC
  Administered 2017-04-01 – 2017-04-02 (×2): 5000 [IU] via SUBCUTANEOUS
  Filled 2017-04-01 (×2): qty 1

## 2017-04-01 MED ORDER — BUPIVACAINE-EPINEPHRINE (PF) 0.5% -1:200000 IJ SOLN
INTRAMUSCULAR | Status: DC | PRN
  Administered 2017-04-01: 30 mL

## 2017-04-01 MED ORDER — SUGAMMADEX SODIUM 500 MG/5ML IV SOLN
INTRAVENOUS | Status: AC
  Filled 2017-04-01: qty 5

## 2017-04-01 MED ORDER — SCOPOLAMINE 1 MG/3DAYS TD PT72
MEDICATED_PATCH | TRANSDERMAL | Status: DC | PRN
  Administered 2017-04-01: 1 via TRANSDERMAL

## 2017-04-01 MED ORDER — FENTANYL CITRATE (PF) 100 MCG/2ML IJ SOLN
INTRAMUSCULAR | Status: DC | PRN
  Administered 2017-04-01 (×4): 50 ug via INTRAVENOUS

## 2017-04-01 MED ORDER — ACETAMINOPHEN 325 MG PO TABS
650.0000 mg | ORAL_TABLET | ORAL | Status: DC
  Administered 2017-04-01 – 2017-04-02 (×5): 650 mg via ORAL
  Filled 2017-04-01 (×5): qty 2

## 2017-04-01 MED ORDER — KETOROLAC TROMETHAMINE 30 MG/ML IJ SOLN
INTRAMUSCULAR | Status: DC | PRN
  Administered 2017-04-01: 30 mg via INTRAVENOUS

## 2017-04-01 MED ORDER — HYDROMORPHONE HCL 1 MG/ML IJ SOLN
INTRAMUSCULAR | Status: AC
  Filled 2017-04-01: qty 1

## 2017-04-01 MED ORDER — IOPAMIDOL (ISOVUE-300) INJECTION 61%
INTRAVENOUS | Status: AC
  Filled 2017-04-01: qty 50

## 2017-04-01 MED ORDER — LACTATED RINGERS IR SOLN
Status: DC | PRN
  Administered 2017-04-01: 1000 mL

## 2017-04-01 MED ORDER — OXYCODONE HCL 5 MG PO TABS
5.0000 mg | ORAL_TABLET | ORAL | Status: DC | PRN
  Administered 2017-04-01 (×2): 5 mg via ORAL
  Filled 2017-04-01 (×2): qty 1
  Filled 2017-04-01: qty 2

## 2017-04-01 MED ORDER — SODIUM CHLORIDE 0.9 % IV SOLN
2.0000 g | Freq: Every day | INTRAVENOUS | Status: DC
  Administered 2017-04-01: 2 g via INTRAVENOUS
  Filled 2017-04-01: qty 20
  Filled 2017-04-01: qty 2

## 2017-04-01 MED ORDER — ACETAMINOPHEN 500 MG PO TABS
1000.0000 mg | ORAL_TABLET | Freq: Once | ORAL | Status: AC
  Administered 2017-04-01: 1000 mg via ORAL
  Filled 2017-04-01: qty 2

## 2017-04-01 MED ORDER — BUPIVACAINE-EPINEPHRINE (PF) 0.5% -1:200000 IJ SOLN
INTRAMUSCULAR | Status: AC
  Filled 2017-04-01: qty 30

## 2017-04-01 MED ORDER — PANTOPRAZOLE SODIUM 40 MG PO TBEC
40.0000 mg | DELAYED_RELEASE_TABLET | Freq: Every day | ORAL | Status: DC
  Administered 2017-04-01 – 2017-04-02 (×2): 40 mg via ORAL
  Filled 2017-04-01 (×3): qty 1

## 2017-04-01 MED ORDER — ONDANSETRON HCL 4 MG/2ML IJ SOLN
INTRAMUSCULAR | Status: DC | PRN
  Administered 2017-04-01: 4 mg via INTRAVENOUS

## 2017-04-01 MED ORDER — LIDOCAINE 2% (20 MG/ML) 5 ML SYRINGE
INTRAMUSCULAR | Status: DC | PRN
  Administered 2017-04-01: 100 mg via INTRAVENOUS

## 2017-04-01 MED ORDER — PROPOFOL 10 MG/ML IV BOLUS
INTRAVENOUS | Status: AC
  Filled 2017-04-01: qty 40

## 2017-04-01 MED ORDER — PHENYLEPHRINE 40 MCG/ML (10ML) SYRINGE FOR IV PUSH (FOR BLOOD PRESSURE SUPPORT)
PREFILLED_SYRINGE | INTRAVENOUS | Status: AC
  Filled 2017-04-01: qty 10

## 2017-04-01 MED ORDER — IOPAMIDOL (ISOVUE-300) INJECTION 61%
INTRAVENOUS | Status: DC | PRN
  Administered 2017-04-01: 7.5 mL

## 2017-04-01 MED ORDER — DEXAMETHASONE SODIUM PHOSPHATE 10 MG/ML IJ SOLN
INTRAMUSCULAR | Status: AC
  Filled 2017-04-01: qty 1

## 2017-04-01 MED ORDER — ALBUTEROL SULFATE HFA 108 (90 BASE) MCG/ACT IN AERS
2.0000 | INHALATION_SPRAY | Freq: Once | RESPIRATORY_TRACT | Status: AC
  Administered 2017-04-01: 2 via RESPIRATORY_TRACT

## 2017-04-01 MED ORDER — ONDANSETRON 4 MG PO TBDP: 4.0000 mg | ORAL_TABLET | Freq: Four times a day (QID) | ORAL | Status: DC | PRN

## 2017-04-01 MED ORDER — FENTANYL CITRATE (PF) 100 MCG/2ML IJ SOLN
12.5000 ug | INTRAMUSCULAR | Status: DC | PRN
  Administered 2017-04-01 (×2): 12.5 ug via INTRAVENOUS
  Filled 2017-04-01 (×2): qty 2

## 2017-04-01 MED ORDER — MEPERIDINE HCL 50 MG/ML IJ SOLN: 6.2500 mg | INTRAMUSCULAR | Status: DC | PRN

## 2017-04-01 MED ORDER — MIDAZOLAM HCL 5 MG/5ML IJ SOLN
INTRAMUSCULAR | Status: DC | PRN
  Administered 2017-04-01: 2 mg via INTRAVENOUS

## 2017-04-01 SURGICAL SUPPLY — 47 items
APPLICATOR ARISTA FLEXITIP XL (MISCELLANEOUS) IMPLANT
APPLIER CLIP 5 13 M/L LIGAMAX5 (MISCELLANEOUS)
APPLIER CLIP ROT 10 11.4 M/L (STAPLE) ×2
BANDAGE ADH SHEER 1  50/CT (GAUZE/BANDAGES/DRESSINGS) ×8 IMPLANT
BENZOIN TINCTURE PRP APPL 2/3 (GAUZE/BANDAGES/DRESSINGS) IMPLANT
CABLE HIGH FREQUENCY MONO STRZ (ELECTRODE) ×2 IMPLANT
CHLORAPREP W/TINT 26ML (MISCELLANEOUS) ×2 IMPLANT
CLIP APPLIE 5 13 M/L LIGAMAX5 (MISCELLANEOUS) IMPLANT
CLIP APPLIE ROT 10 11.4 M/L (STAPLE) ×1 IMPLANT
CLIP VESOLOCK MED LG 6/CT (CLIP) IMPLANT
COVER MAYO STAND STRL (DRAPES) ×2 IMPLANT
COVER SURGICAL LIGHT HANDLE (MISCELLANEOUS) ×2 IMPLANT
DECANTER SPIKE VIAL GLASS SM (MISCELLANEOUS) IMPLANT
DERMABOND ADVANCED (GAUZE/BANDAGES/DRESSINGS)
DERMABOND ADVANCED .7 DNX12 (GAUZE/BANDAGES/DRESSINGS) IMPLANT
DRAPE C-ARM 42X120 X-RAY (DRAPES) ×2 IMPLANT
DRSG TEGADERM 2-3/8X2-3/4 SM (GAUZE/BANDAGES/DRESSINGS) IMPLANT
ELECT PENCIL ROCKER SW 15FT (MISCELLANEOUS) IMPLANT
ELECT REM PT RETURN 15FT ADLT (MISCELLANEOUS) ×2 IMPLANT
GAUZE SPONGE 2X2 8PLY STRL LF (GAUZE/BANDAGES/DRESSINGS) IMPLANT
GLOVE BIO SURGEON STRL SZ7.5 (GLOVE) ×2 IMPLANT
GLOVE INDICATOR 8.0 STRL GRN (GLOVE) ×2 IMPLANT
GOWN STRL REUS W/TWL XL LVL3 (GOWN DISPOSABLE) ×6 IMPLANT
GRASPER SUT TROCAR 14GX15 (MISCELLANEOUS) IMPLANT
HEMOSTAT ARISTA ABSORB 3G PWDR (MISCELLANEOUS) IMPLANT
HEMOSTAT SNOW SURGICEL 2X4 (HEMOSTASIS) IMPLANT
KIT BASIN OR (CUSTOM PROCEDURE TRAY) ×2 IMPLANT
L-HOOK LAP DISP 36CM (ELECTROSURGICAL)
LHOOK LAP DISP 36CM (ELECTROSURGICAL) IMPLANT
POUCH RETRIEVAL ECOSAC 10 (ENDOMECHANICALS) ×1 IMPLANT
POUCH RETRIEVAL ECOSAC 10MM (ENDOMECHANICALS) ×1
SCISSORS LAP 5X35 DISP (ENDOMECHANICALS) ×2 IMPLANT
SET CHOLANGIOGRAPH MIX (MISCELLANEOUS) ×2 IMPLANT
SET IRRIG TUBING LAPAROSCOPIC (IRRIGATION / IRRIGATOR) ×2 IMPLANT
SLEEVE XCEL OPT CAN 5 100 (ENDOMECHANICALS) ×4 IMPLANT
SPONGE GAUZE 2X2 STER 10/PKG (GAUZE/BANDAGES/DRESSINGS)
STRIP CLOSURE SKIN 1/2X4 (GAUZE/BANDAGES/DRESSINGS) IMPLANT
SUT MNCRL AB 4-0 PS2 18 (SUTURE) ×2 IMPLANT
SUT VICRYL 0 TIES 12 18 (SUTURE) IMPLANT
SUT VICRYL 0 UR6 27IN ABS (SUTURE) IMPLANT
TOWEL OR 17X26 10 PK STRL BLUE (TOWEL DISPOSABLE) ×2 IMPLANT
TOWEL OR NON WOVEN STRL DISP B (DISPOSABLE) ×2 IMPLANT
TRAY LAPAROSCOPIC (CUSTOM PROCEDURE TRAY) ×2 IMPLANT
TROCAR BLADELESS OPT 5 100 (ENDOMECHANICALS) ×2 IMPLANT
TROCAR XCEL BLUNT TIP 100MML (ENDOMECHANICALS) IMPLANT
TROCAR XCEL NON-BLD 11X100MML (ENDOMECHANICALS) IMPLANT
TUBING INSUF HEATED (TUBING) ×2 IMPLANT

## 2017-04-01 NOTE — Progress Notes (Signed)
Patient consistently complains of pain on her abdomen even  after second dose of pain medicine. On call MD was notified for the second time.

## 2017-04-01 NOTE — Anesthesia Postprocedure Evaluation (Signed)
Anesthesia Post Note  Patient: Karen KindsJennifer L Mendoza  Procedure(s) Performed: LAPAROSCOPIC CHOLECYSTECTOMY WITH INTRAOPERATIVE CHOLANGIOGRAM (N/A )     Patient location during evaluation: PACU Anesthesia Type: General Level of consciousness: awake and alert and oriented Pain management: pain level controlled Vital Signs Assessment: post-procedure vital signs reviewed and stable Respiratory status: spontaneous breathing, nonlabored ventilation, respiratory function stable and patient connected to nasal cannula oxygen Cardiovascular status: blood pressure returned to baseline and stable Postop Assessment: no apparent nausea or vomiting Anesthetic complications: no    Last Vitals:  Vitals:   04/01/17 1330 04/01/17 1400  BP: (!) 141/84 (!) 153/90  Pulse: 82 82  Resp: 13 11  Temp:    SpO2: 97% 94%    Last Pain:  Vitals:   04/01/17 1330  TempSrc:   PainSc: Asleep                 Clayson Riling A.

## 2017-04-01 NOTE — Interval H&P Note (Signed)
History and Physical Interval Note:  04/01/2017 8:05 AM  Karen Mendoza  has presented today for surgery, with the diagnosis of cholecystitis  The various methods of treatment have been discussed with the patient and family. After consideration of risks, benefits and other options for treatment, the patient has consented to  Procedure(s): LAPAROSCOPIC CHOLECYSTECTOMY WITH INTRAOPERATIVE CHOLANGIOGRAM (N/A) as a surgical intervention .  The patient's history has been reviewed, patient examined, no change in status, stable for surgery.  I have reviewed the patient's chart and labs.  Questions were answered to the patient's satisfaction.    Chart reviewed Pt seen/examined Acute onset pain yesterday after eating; RUQ. Some n. No prior symptoms  Severe obesity cta Reg Soft, RUQ TTP, no rebound/peritonitis  I believe the patient's symptoms are consistent with gallbladder disease.  We discussed gallbladder disease. The patient was given Agricultural engineereducational material. We discussed non-operative and operative management. We discussed the signs & symptoms of acute cholecystitis  I discussed laparoscopic cholecystectomy with possible IOC in detail.  The patient was shown diagrams detailing the procedure.  We discussed the risks and benefits of a laparoscopic cholecystectomy including, but not limited to bleeding, infection, injury to surrounding structures such as the intestine or liver, bile leak, retained gallstones, need to convert to an open procedure, prolonged diarrhea, blood clots such as  DVT, common bile duct injury, anesthesia risks, and possible need for additional procedures.  We discussed the typical post-operative recovery course. I explained that the likelihood of improvement of their symptoms is good.   Gaynelle AduEric Oneal Biglow

## 2017-04-01 NOTE — H&P (Signed)
Chief Complaint:  Right upper quadrant pain  History of Present Illness:  Karen Mendoza is an 47 y.o. female transferred from [place].  Yesterday afternoon after eating a fried [employer] sandwich she began having right upper quadrant pain radiating into her back.  This was associated with nausea and the pain was unrelenting.  Ultrasound showed findings consistent with acute cholecystitis.    Past Medical History:  Diagnosis Date  . Asthma   . Environmental and seasonal allergies     Past Surgical History:  Procedure Laterality Date  . APPENDECTOMY    . BACK SURGERY      Current Facility-Administered Medications  Medication Dose Route Frequency Provider Last Rate Last Dose  . morphine 4 MG/ML injection 4 mg  4 mg Intravenous Q1H PRN Tanna Furry, MD   4 mg at 03/31/17 2325   Current Outpatient Medications  Medication Sig Dispense Refill  . cetirizine (ZYRTEC) 10 MG tablet Take 10 mg by mouth daily.    Marland Kitchen omeprazole (PRILOSEC) 20 MG capsule Take 20 mg by mouth daily.     Indomethacin No family history on file. Social History:   reports that she has never smoked. She has never used smokeless tobacco. She reports that she does not drink alcohol or use drugs.   REVIEW OF SYSTEMS : Negative except for hx of BTL;  No bleeding problems;  No hx of DVT;    Physical Exam:   Blood pressure (!) 150/71, pulse 74, temperature 98.9 F (37.2 C), temperature source Oral, resp. rate 20, height 5' 10" (1.778 m), weight 136.1 kg (300 lb), last menstrual period 03/17/2017, SpO2 100 %. Body mass index is 43.05 kg/m.  Gen:  WDWNobese WF NAD  Neurological: Alert and oriented to person, place, and time. Motor and sensory function is grossly intact  Head: Normocephalic and atraumatic.  Eyes: Conjunctivae are normal. Pupils are equal, round, and reactive to light. No scleral icterus.  Neck: Normal range of motion. Neck supple. No tracheal deviation or thyromegaly present.   Cardiovascular:  SR without murmurs or gallops.  No carotid bruits Breast:  Not examined Respiratory: Effort normal.  No respiratory distress. No chest wall tenderness. Breath sounds normal.  No wheezes, rales or rhonchi.  Abdomen:  Tender in the right upper quadrant GU:  Not examined Musculoskeletal: Normal range of motion. Extremities are nontender. No cyanosis, edema or clubbing noted Lymphadenopathy: No cervical, preauricular, postauricular or axillary adenopathy is present Skin: Skin is warm and dry. No rash noted. No diaphoresis. No erythema. No pallor. Pscyh: Normal mood and affect. Behavior is normal. Judgment and thought content normal.   LABORATORY RESULTS: Results for orders placed or performed during the hospital encounter of 03/31/17 (from the past 48 hour(s))  Urinalysis, Routine w reflex microscopic     Status: Abnormal   Collection Time: 03/31/17  7:12 PM  Result Value Ref Range   Color, Urine YELLOW YELLOW   APPearance CLEAR CLEAR   Specific Gravity, Urine >1.030 (H) 1.005 - 1.030   pH 6.0 5.0 - 8.0   Glucose, UA NEGATIVE NEGATIVE mg/dL   Hgb urine dipstick NEGATIVE NEGATIVE   Bilirubin Urine NEGATIVE NEGATIVE   Ketones, ur NEGATIVE NEGATIVE mg/dL   Protein, ur NEGATIVE NEGATIVE mg/dL   Nitrite NEGATIVE NEGATIVE   Leukocytes, UA NEGATIVE NEGATIVE    Comment: Microscopic not done on urines with negative protein, blood, leukocytes, nitrite, or glucose < 500 mg/dL. Performed at [facility], [address],  [place], [place] [address]   Pregnancy, urine     Status: None   Collection Time: 03/31/17  7:12 PM  Result Value Ref Range   Preg Test, Ur NEGATIVE NEGATIVE    Comment:        THE SENSITIVITY OF THIS METHODOLOGY IS >20 mIU/mL. Performed at [facility], [address] [place], [place] [address]   Lipase, blood     Status: None   Collection Time: 03/31/17  8:48 PM  Result Value Ref Range   Lipase 21 11 - 51 U/L    Comment: Performed  at [facility], [address] [place], [place] [address]  Comprehensive metabolic panel     Status: Abnormal   Collection Time: 03/31/17  8:48 PM  Result Value Ref Range   Sodium 134 (L) 135 - 145 mmol/L   Potassium 3.6 3.5 - 5.1 mmol/L   Chloride 99 (L) 101 - 111 mmol/L   CO2 24 22 - 32 mmol/L   Glucose, Bld 119 (H) 65 - 99 mg/dL   BUN 11 6 - 20 mg/dL   Creatinine, Ser 0.74 0.44 - 1.00 mg/dL   Calcium 9.0 8.9 - 10.3 mg/dL   Total Protein 7.4 6.5 - 8.1 g/dL   Albumin 3.8 3.5 - 5.0 g/dL   AST 19 15 - 41 U/L   ALT 9 (L) 14 - 54 U/L   Alkaline Phosphatase 55 38 - 126 U/L   Total Bilirubin 0.5 0.3 - 1.2 mg/dL   GFR calc non Af Amer >60 >60 mL/min   GFR calc Af Amer >60 >60 mL/min    Comment: (NOTE) The eGFR has been calculated using the CKD EPI equation. This calculation has not been validated in all clinical situations. eGFR's persistently <60 mL/min signify possible Chronic Kidney Disease.    Anion gap 11 5 - 15    Comment: Performed at [facility], [address] [place], [place] [address]  CBC     Status: Abnormal   Collection Time: 03/31/17  8:48 PM  Result Value Ref Range   WBC 13.6 (H) 4.0 - 10.5 K/uL   RBC 4.22 3.87 - 5.11 MIL/uL   Hemoglobin 11.3 (L) 12.0 - 15.0 g/dL   HCT 34.2 (L) 36.0 - 46.0 %   MCV 81.0 78.0 - 100.0 fL   MCH 26.8 26.0 - 34.0 pg   MCHC 33.0 30.0 - 36.0 g/dL   RDW 14.8 11.5 - 15.5 %   Platelets 345 150 - 400 K/uL    Comment: Performed at [facility], [address] [place], [place] [address]     RADIOLOGY RESULTS: US Abdomen Complete  Result Date: 03/31/2017 CLINICAL DATA:  Initial evaluation for acute right upper quadrant pain, positive Murphy's sign. EXAM: ABDOMEN ULTRASOUND COMPLETE COMPARISON:  None. FINDINGS: Gallbladder: Gallbladder is distended measuring up to approximately 10 cm. Internal stones and sludge present within the gallbladder lumen. Gallbladder wall mildly thickened up to 4 mm with small  amount of free pericholecystic fluid. Sonographic Percell Miller sign was elicited on exam. Common bile duct: Diameter: Not visualized. Liver: No focal lesion identified. Increased echogenicity, suggesting steatosis. Portal vein is patent on color Doppler imaging with normal direction of blood flow towards the liver. IVC: Not visualized. Pancreas: Poorly visualized due to body habitus. Spleen: Size and appearance within normal limits. Right Kidney: Length: 12.1 cm. Echogenicity within normal limits. No mass or hydronephrosis visualized. Left Kidney: Length: 14.4 cm.  Echogenicity within normal limits. No mass or hydronephrosis visualized. Abdominal aorta: No aneurysm visualized. Other findings: None. IMPRESSION: 1. Distended gallbladder with internal stones and sludge, mild gallbladder wall thickening, with positive sonographic Murphy sign. Clinical correlation for possible acute cholecystitis recommended. 2. Evaluation of the biliary tree limited on this exam due to body habitus. Electronically Signed   By: Jeannine Boga M.D.   On: 03/31/2017 21:53    Problem List: Patient Active Problem List   Diagnosis Date Noted  . Cholecystitis, acute 03/31/2017    Assessment & Plan: Acute cholecystitis.  Plan admit to [facility] MD and lap chole per Dr. Greer Pickerel    Matt B. Hassell Done, MD, [facility] [phone] beeper [phone]  04/01/2017 12:00 AM

## 2017-04-01 NOTE — Discharge Instructions (Signed)

## 2017-04-01 NOTE — Anesthesia Procedure Notes (Signed)
Procedure Name: Intubation Date/Time: 04/01/2017 10:30 AM Performed by: Lind Covert, CRNA Pre-anesthesia Checklist: Patient identified, Emergency Drugs available, Suction available, Patient being monitored and Timeout performed Patient Re-evaluated:Patient Re-evaluated prior to induction Oxygen Delivery Method: Circle system utilized Preoxygenation: Pre-oxygenation with 100% oxygen Induction Type: IV induction Laryngoscope Size: Mac and 4 Grade View: Grade I Tube type: Oral Tube size: 7.5 mm Number of attempts: 1 Airway Equipment and Method: Stylet Placement Confirmation: ETT inserted through vocal cords under direct vision,  positive ETCO2 and breath sounds checked- equal and bilateral Secured at: 22 cm Tube secured with: Tape Dental Injury: Teeth and Oropharynx as per pre-operative assessment

## 2017-04-01 NOTE — Op Note (Signed)
Karen Mendoza [id number] [birth year] 04/01/2017  Laparoscopic Cholecystectomy with IOC Procedure Note  Indications: This patient presents with symptomatic gallbladder disease and will undergo laparoscopic cholecystectomy.  Pre-operative Diagnosis: Calculus of gallbladder with acute cholecystitis, without mention of obstruction  Post-operative Diagnosis: Same; fatty liver  Surgeon: Gaynelle Adu MD FACS  Assistants: Barnetta Chapel PA-C  Anesthesia: General endotracheal anesthesia  Procedure Details  The patient was seen again in the Holding Room. The risks, benefits, complications, treatment options, and expected outcomes were discussed with the patient. The possibilities of reaction to medication, pulmonary aspiration, perforation of viscus, bleeding, recurrent infection, finding a normal gallbladder, the need for additional procedures, failure to diagnose a condition, the possible need to convert to an open procedure, and creating a complication requiring transfusion or operation were discussed with the patient. The likelihood of improving the patient's symptoms with return to their baseline status is good.  The patient and/or family concurred with the proposed plan, giving informed consent. The site of surgery properly noted. The patient was taken to Operating Room, identified as Karen Mendoza and the procedure verified as Laparoscopic Cholecystectomy with Intraoperative Cholangiogram. A Time Out was held and the above information confirmed. Antibiotic prophylaxis was administered.   Prior to the induction of general anesthesia, antibiotic prophylaxis was administered. General endotracheal anesthesia was then administered and tolerated well. After the induction, the abdomen was prepped with Chloraprep and draped in the sterile fashion. The patient was positioned in the supine position.  Because of her central truncal obesity I elected to gain access to the abdomen using the Optiview  technique.  A small left subcostal incision was made.  Then using a 0 degree 5 mm laparoscope through a 5 mm trocar I advanced it through all layers of the abdominal cavity and entered the abdomen.  Pneumoperitoneum was smoothly established up to a patient pressure of 15 mmHg.  There are no change in patient vital signs.  The surrounding abdomen was inspected.  There is no evidence of injury to surrounding structures.  She had a fatty liver.  The gallbladder was distended and thick-walled and had edema in the wall.   We positioned the patient in reverse Trendelenburg, tilted slightly to the patient's left.  I placed a 5 mm trocar in the supraumbilical position and 2 5 mm trochars in the right abdomen all under direct visualization.  I exchanged the left upper quadrant 5 mm trocar for a 12 mm trocar.   The gallbladder was identified.  Because the gallbladder was very inflamed, distended and thick walled it could not be retracted so therefore it was aspirated.  the fundus was then grasped and retracted cephalad. Adhesions were lysed bluntly and with the electrocautery where indicated, taking care not to injure any adjacent organs or viscus. The infundibulum was grasped and retracted laterally, exposing the peritoneum overlying the triangle of Calot. This was then divided and exposed in a blunt fashion. A critical view of the cystic duct and cystic artery was obtained.  The cystic duct was clearly identified and bluntly dissected circumferentially. The cystic duct was ligated with a clip distally.   An incision was made in the cystic duct and the [facility] cholangiogram catheter introduced. The catheter was secured using a clip. A cholangiogram was then obtained which showed good visualization of the distal and proximal biliary tree with no sign of filling defects or obstruction.  Contrast flowed easily into the duodenum. The catheter was then removed.   The cystic duct was then  ligated with clips and divided. The  cystic artery which had been identified & dissected free was ligated with clips and divided as well.   The gallbladder was dissected from the liver bed in retrograde fashion with the electrocautery. The gallbladder was removed and placed in an Ecco sac.  I then irrigated the gallbladder fossa with saline.  Hemostasis was achieved with electrocautery.  There is no evidence of additional bleeding or bile leak.  The gallbladder and Ecco sac were then removed through the left upper quadrant port site.  Because of the large gallstones and distended gallbladder I ended up having to incise the fascial incision in the left upper quadrant in order to extract the back and the gallbladder.  I reapproximated the fascial defect in the left upper quadrant with 5 interrupted 0 Vicryl sutures using the PMI suture passer with laparoscopic assistance  We again inspected the right upper quadrant for hemostasis.  The left upper quadrant closure was inspected and there was no air leak and nothing trapped within the closure. Pneumoperitoneum was released as we removed the trocars.  4-0 Monocryl was used to close the skin.   Benzoin, steri-strips, and clean dressings were applied. The patient was then extubated and brought to the recovery room in stable condition. Instrument, sponge, and needle counts were correct at closure and at the conclusion of the case.   Findings: Acute Cholecystitis with Cholelithiasis  Estimated Blood Loss: less than 50 mL         Drains: none         Specimens: Gallbladder           Complications: None; patient tolerated the procedure well.         Disposition: PACU - hemodynamically stable.         Condition: stable  Karen SellaEric M. Andrey CampanileWilson, MD, FACS General, Bariatric, & Minimally Invasive Surgery [facility] [place]

## 2017-04-01 NOTE — Anesthesia Preprocedure Evaluation (Addendum)
Anesthesia Evaluation  Patient identified by MRN, date of birth, ID band Patient awake    Reviewed: Allergy & Precautions, NPO status , Patient's Chart, lab work & pertinent test results  Airway Mallampati: II  TM Distance: >3 FB Neck ROM: Full    Dental  (+) Teeth Intact   Pulmonary asthma ,    Pulmonary exam normal breath sounds clear to auscultation       Cardiovascular Normal cardiovascular exam Rhythm:Regular Rate:Normal     Neuro/Psych negative neurological ROS  negative psych ROS   GI/Hepatic Neg liver ROS, GERD  Medicated and Controlled,Cholelithiasis with acute cholecystitis   Endo/Other  Morbid obesity  Renal/GU negative Renal ROS  negative genitourinary   Musculoskeletal negative musculoskeletal ROS (+)   Abdominal (+) + obese,   Peds  Hematology  (+) anemia ,   Anesthesia Other Findings   Reproductive/Obstetrics negative OB ROS                            Anesthesia Physical Anesthesia Plan  ASA: III and emergent  Anesthesia Plan: General   Post-op Pain Management:    Induction: Intravenous, Rapid sequence and Cricoid pressure planned  PONV Risk Score and Plan: 4 or greater and Scopolamine patch - Pre-op, Midazolam, Dexamethasone, Ondansetron and Treatment may vary due to age or medical condition  Airway Management Planned: Oral ETT  Additional Equipment:   Intra-op Plan:   Post-operative Plan: Extubation in OR  Informed Consent: I have reviewed the patients History and Physical, chart, labs and discussed the procedure including the risks, benefits and alternatives for the proposed anesthesia with the patient or authorized representative who has indicated his/her understanding and acceptance.   Dental advisory given  Plan Discussed with: CRNA, Surgeon and Anesthesiologist  Anesthesia Plan Comments:         Anesthesia Quick Evaluation

## 2017-04-01 NOTE — Transfer of Care (Signed)
Immediate Anesthesia Transfer of Care Note  Patient: Karen Mendoza  Procedure(s) Performed: LAPAROSCOPIC CHOLECYSTECTOMY WITH INTRAOPERATIVE CHOLANGIOGRAM (N/A )  Patient Location: PACU  Anesthesia Type:General  Level of Consciousness: sedated  Airway & Oxygen Therapy: Patient Spontanous Breathing and Patient connected to face mask oxygen  Post-op Assessment: Report given to RN and Post -op Vital signs reviewed and stable  Post vital signs: Reviewed and stable  Last Vitals:  Vitals Value Taken Time  BP 154/82 04/01/2017 12:40 PM  Temp    Pulse 105 04/01/2017 12:42 PM  Resp 17 04/01/2017 12:42 PM  SpO2 98 % 04/01/2017 12:42 PM  Vitals shown include unvalidated device data.  Last Pain:  Vitals:   04/01/17 1240  TempSrc:   PainSc: (P) Asleep      Patients Stated Pain Goal: 5 (04/01/17 0806)  Complications: No apparent anesthesia complications

## 2017-04-01 NOTE — Progress Notes (Addendum)
Patient reports not having relief from the previous pain medicine. On cal MD paged. We will continue to monitor

## 2017-04-02 ENCOUNTER — Encounter (HOSPITAL_COMMUNITY): Payer: Self-pay | Admitting: General Surgery

## 2017-04-02 MED ORDER — ACETAMINOPHEN 325 MG PO TABS: 650.0000 mg | ORAL_TABLET | ORAL | Status: DC

## 2017-04-02 NOTE — Discharge Summary (Signed)
[facility] Discharge Summary   Patient ID: LARRIE LUCIA MRN: [id number] DOB/AGE: Jan 26, 1970 47 y.o.  Admit date: 03/31/2017 Discharge date: 04/02/2017  Discharge Diagnosis Patient Active Problem List   Diagnosis Date Noted  . Acute cholecystitis 04/01/2017  . Cholecystitis, acute 03/31/2017   Imaging: Dg Cholangiogram Operative  Result Date: 04/01/2017 CLINICAL DATA:  47 year old female with a history EXAM: INTRAOPERATIVE CHOLANGIOGRAM TECHNIQUE: Cholangiographic images from the C-arm fluoroscopic device were submitted for interpretation post-operatively. Please see the procedural report for the amount of contrast and the fluoroscopy time utilized. COMPARISON:  None. FINDINGS: Surgical instruments project over the upper abdomen. There is cannulation of the cystic duct/gallbladder neck, with antegrade infusion of contrast. Caliber of the extrahepatic ductal system within normal limits. No definite filling defect within the extrahepatic ducts identified. Free flow of contrast across the ampulla. IMPRESSION: Intraoperative cholangiogram demonstrates extrahepatic biliary ducts of unremarkable caliber, with no definite filling defects identified. Free flow of contrast across the ampulla. Please refer to the dictated operative report for full details of intraoperative findings and procedure Electronically Signed   By: Gilmer Mor D.O.   On: 04/01/2017 15:23   US Abdomen Complete  Result Date: 03/31/2017 CLINICAL DATA:  Initial evaluation for acute right upper quadrant pain, positive Murphy's sign. EXAM: ABDOMEN ULTRASOUND COMPLETE COMPARISON:  None. FINDINGS: Gallbladder: Gallbladder is distended measuring up to approximately 10 cm. Internal stones and sludge present within the gallbladder lumen. Gallbladder wall mildly thickened up to 4 mm with small amount of free pericholecystic fluid. Sonographic Eulah Pont sign was elicited on exam. Common bile duct: Diameter: Not visualized.  Liver: No focal lesion identified. Increased echogenicity, suggesting steatosis. Portal vein is patent on color Doppler imaging with normal direction of blood flow towards the liver. IVC: Not visualized. Pancreas: Poorly visualized due to body habitus. Spleen: Size and appearance within normal limits. Right Kidney: Length: 12.1 cm. Echogenicity within normal limits. No mass or hydronephrosis visualized. Left Kidney: Length: 14.4 cm. Echogenicity within normal limits. No mass or hydronephrosis visualized. Abdominal aorta: No aneurysm visualized. Other findings: None. IMPRESSION: 1. Distended gallbladder with internal stones and sludge, mild gallbladder wall thickening, with positive sonographic Murphy sign. Clinical correlation for possible acute cholecystitis recommended. 2. Evaluation of the biliary tree limited on this exam due to body habitus. Electronically Signed   By: Rise Mu M.D.   On: 03/31/2017 21:53    Procedures Dr. Gaynelle Adu - Laparoscopic Cholecystectomy with [facility]   Hospital Course:  47 year old obese female who presented to [facility] from [facility] with a diagnosis of acute cholecystitis.  The patient had acute right upper quadrant pain.  Right upper quadrant ultrasound revealed findings of calculus cholecystitis and the patient had a white blood cell count of over 13,000.  Patient was admitted and underwent procedure listed above.  Tolerated procedure well and was transferred to the floor.  Diet was advanced as tolerated.  On POD#1, the patient was voiding well, tolerating diet, ambulating well, pain well controlled, vital signs stable, incisions c/d/i and felt stable for discharge home.  Patient will follow up in our office in 2-3 weeks and knows to call with questions or concerns.   Physical Exam: General:  Alert, NAD, pleasant, comfortable Abd:  Soft, appropriately tender, incisions clean and dry, some dried blood on LUQ trochar site, no active  bleeding.  Allergies as of 04/02/2017      Reactions   Indomethacin Other (See Comments)   Causes asthma to flare up  Medication List    TAKE these medications   acetaminophen 325 MG tablet Commonly known as:  TYLENOL Take 2 tablets (650 mg total) by mouth every 4 (four) hours.   albuterol 108 (90 Base) MCG/ACT inhaler Commonly known as:  PROVENTIL HFA;VENTOLIN HFA Inhale 1-2 puffs into the lungs every 6 (six) hours as needed for wheezing or shortness of breath.   cetirizine 10 MG tablet Commonly known as:  ZYRTEC Take 10 mg by mouth at bedtime.   cholecalciferol 1000 units tablet Commonly known as:  VITAMIN D Take 1,000 Units by mouth 2 (two) times daily.   cimetidine 400 MG tablet Commonly known as:  TAGAMET Take 1 tablet by mouth 3 (three) times daily.   loratadine 10 MG tablet Commonly known as:  CLARITIN Take 10 mg by mouth every morning.       Follow-up Information    [facility] [place]. Call.   Specialty:  General Surgery Why:  to confirm follow up appointment date/time. Contact information: [address] [place] [id number] [phone]          Signed: Hosie SpangleElizabeth Simaan, [facility] 04/02/2017, 3:40 PM Pager: [phone] Consults: [phone] Mon-Fri 7:00 am-4:30 pm Sat-Sun 7:00 am-11:30 am

## 2017-04-10 DIAGNOSIS — R05 Cough: Secondary | ICD-10-CM | POA: Diagnosis not present

## 2017-04-10 DIAGNOSIS — J3081 Allergic rhinitis due to animal (cat) (dog) hair and dander: Secondary | ICD-10-CM | POA: Diagnosis not present

## 2017-04-10 DIAGNOSIS — J3089 Other allergic rhinitis: Secondary | ICD-10-CM | POA: Diagnosis not present

## 2017-04-10 DIAGNOSIS — J301 Allergic rhinitis due to pollen: Secondary | ICD-10-CM | POA: Diagnosis not present

## 2017-07-17 DIAGNOSIS — E559 Vitamin D deficiency, unspecified: Secondary | ICD-10-CM | POA: Diagnosis not present

## 2017-07-17 DIAGNOSIS — J4520 Mild intermittent asthma, uncomplicated: Secondary | ICD-10-CM | POA: Diagnosis not present

## 2017-07-17 DIAGNOSIS — Z0000 Encounter for general adult medical examination without abnormal findings: Secondary | ICD-10-CM | POA: Diagnosis not present

## 2017-07-17 DIAGNOSIS — Z13220 Encounter for screening for lipoid disorders: Secondary | ICD-10-CM | POA: Diagnosis not present

## 2017-07-17 DIAGNOSIS — J309 Allergic rhinitis, unspecified: Secondary | ICD-10-CM | POA: Diagnosis not present

## 2017-08-26 ENCOUNTER — Other Ambulatory Visit: Payer: Self-pay | Admitting: Family Medicine

## 2017-08-26 DIAGNOSIS — Z1231 Encounter for screening mammogram for malignant neoplasm of breast: Secondary | ICD-10-CM

## 2017-09-11 ENCOUNTER — Ambulatory Visit
Admission: RE | Admit: 2017-09-11 | Discharge: 2017-09-11 | Disposition: A | Discharge status: Home / Self Care | Payer: BLUE CROSS/BLUE SHIELD | Source: Ambulatory Visit | Attending: Family Medicine | Admitting: Family Medicine

## 2017-09-11 DIAGNOSIS — Z1231 Encounter for screening mammogram for malignant neoplasm of breast: Secondary | ICD-10-CM

## 2017-09-18 DIAGNOSIS — H52223 Regular astigmatism, bilateral: Secondary | ICD-10-CM | POA: Diagnosis not present

## 2017-09-18 DIAGNOSIS — Z0100 Encounter for examination of eyes and vision without abnormal findings: Secondary | ICD-10-CM | POA: Diagnosis not present

## 2018-01-06 DIAGNOSIS — J020 Streptococcal pharyngitis: Secondary | ICD-10-CM | POA: Diagnosis not present

## 2018-01-31 DIAGNOSIS — J019 Acute sinusitis, unspecified: Secondary | ICD-10-CM | POA: Diagnosis not present

## 2018-01-31 DIAGNOSIS — J45909 Unspecified asthma, uncomplicated: Secondary | ICD-10-CM | POA: Diagnosis not present

## 2018-02-03 DIAGNOSIS — J4521 Mild intermittent asthma with (acute) exacerbation: Secondary | ICD-10-CM | POA: Diagnosis not present

## 2018-02-03 DIAGNOSIS — J0110 Acute frontal sinusitis, unspecified: Secondary | ICD-10-CM | POA: Diagnosis not present

## 2018-02-24 DIAGNOSIS — R509 Fever, unspecified: Secondary | ICD-10-CM | POA: Diagnosis not present

## 2018-02-24 DIAGNOSIS — J4521 Mild intermittent asthma with (acute) exacerbation: Secondary | ICD-10-CM | POA: Diagnosis not present

## 2018-02-24 DIAGNOSIS — J101 Influenza due to other identified influenza virus with other respiratory manifestations: Secondary | ICD-10-CM | POA: Diagnosis not present

## 2018-03-01 ENCOUNTER — Ambulatory Visit
Admission: RE | Admit: 2018-03-01 | Discharge: 2018-03-01 | Disposition: A | Discharge status: Home / Self Care | Payer: BLUE CROSS/BLUE SHIELD | Source: Ambulatory Visit | Attending: Family Medicine | Admitting: Family Medicine

## 2018-03-01 ENCOUNTER — Other Ambulatory Visit: Payer: Self-pay | Admitting: Family Medicine

## 2018-03-01 DIAGNOSIS — J0110 Acute frontal sinusitis, unspecified: Secondary | ICD-10-CM | POA: Diagnosis not present

## 2018-03-01 DIAGNOSIS — J4541 Moderate persistent asthma with (acute) exacerbation: Secondary | ICD-10-CM

## 2018-03-01 DIAGNOSIS — R05 Cough: Secondary | ICD-10-CM | POA: Diagnosis not present

## 2018-04-27 DIAGNOSIS — K219 Gastro-esophageal reflux disease without esophagitis: Secondary | ICD-10-CM | POA: Diagnosis not present

## 2018-07-23 DIAGNOSIS — Z20828 Contact with and (suspected) exposure to other viral communicable diseases: Secondary | ICD-10-CM | POA: Diagnosis not present

## 2018-07-23 DIAGNOSIS — Z0000 Encounter for general adult medical examination without abnormal findings: Secondary | ICD-10-CM | POA: Diagnosis not present

## 2018-07-28 DIAGNOSIS — U071 COVID-19: Secondary | ICD-10-CM | POA: Diagnosis not present

## 2018-08-20 ENCOUNTER — Other Ambulatory Visit: Payer: Self-pay | Admitting: Family Medicine

## 2018-08-20 DIAGNOSIS — Z1231 Encounter for screening mammogram for malignant neoplasm of breast: Secondary | ICD-10-CM

## 2018-09-29 DIAGNOSIS — Z1159 Encounter for screening for other viral diseases: Secondary | ICD-10-CM | POA: Diagnosis not present

## 2018-10-01 ENCOUNTER — Other Ambulatory Visit: Payer: Self-pay

## 2018-10-01 ENCOUNTER — Ambulatory Visit
Admission: RE | Admit: 2018-10-01 | Discharge: 2018-10-01 | Disposition: A | Discharge status: Home / Self Care | Payer: BLUE CROSS/BLUE SHIELD | Source: Ambulatory Visit | Attending: Family Medicine | Admitting: Family Medicine

## 2018-10-01 DIAGNOSIS — Z1231 Encounter for screening mammogram for malignant neoplasm of breast: Secondary | ICD-10-CM | POA: Diagnosis not present

## 2018-10-04 DIAGNOSIS — Z800 Family history of malignant neoplasm of digestive organs: Secondary | ICD-10-CM | POA: Diagnosis not present

## 2018-10-04 DIAGNOSIS — K640 First degree hemorrhoids: Secondary | ICD-10-CM | POA: Diagnosis not present

## 2018-10-04 DIAGNOSIS — R12 Heartburn: Secondary | ICD-10-CM | POA: Diagnosis not present

## 2018-10-04 DIAGNOSIS — K635 Polyp of colon: Secondary | ICD-10-CM | POA: Diagnosis not present

## 2018-10-04 DIAGNOSIS — D126 Benign neoplasm of colon, unspecified: Secondary | ICD-10-CM | POA: Diagnosis not present

## 2018-10-04 DIAGNOSIS — K219 Gastro-esophageal reflux disease without esophagitis: Secondary | ICD-10-CM | POA: Diagnosis not present

## 2018-10-04 DIAGNOSIS — Z1211 Encounter for screening for malignant neoplasm of colon: Secondary | ICD-10-CM | POA: Diagnosis not present

## 2018-10-04 DIAGNOSIS — K317 Polyp of stomach and duodenum: Secondary | ICD-10-CM | POA: Diagnosis not present

## 2019-04-01 DIAGNOSIS — M546 Pain in thoracic spine: Secondary | ICD-10-CM | POA: Diagnosis not present

## 2019-05-25 IMAGING — RF DG CHOLANGIOGRAM OPERATIVE
1 series · 4 of 4 positions shown · non-contrast
Comparison: None.

CLINICAL DATA: 46-year-old female with a history

EXAM:
INTRAOPERATIVE CHOLANGIOGRAM
TECHNIQUE: Cholangiographic images from the C-arm fluoroscopic device were
submitted for interpretation post-operatively. Please see the
procedural report for the amount of contrast and the fluoroscopy
time utilized.

[Series 1: run · 4 of 60 frames shown]
[frame 10/60]
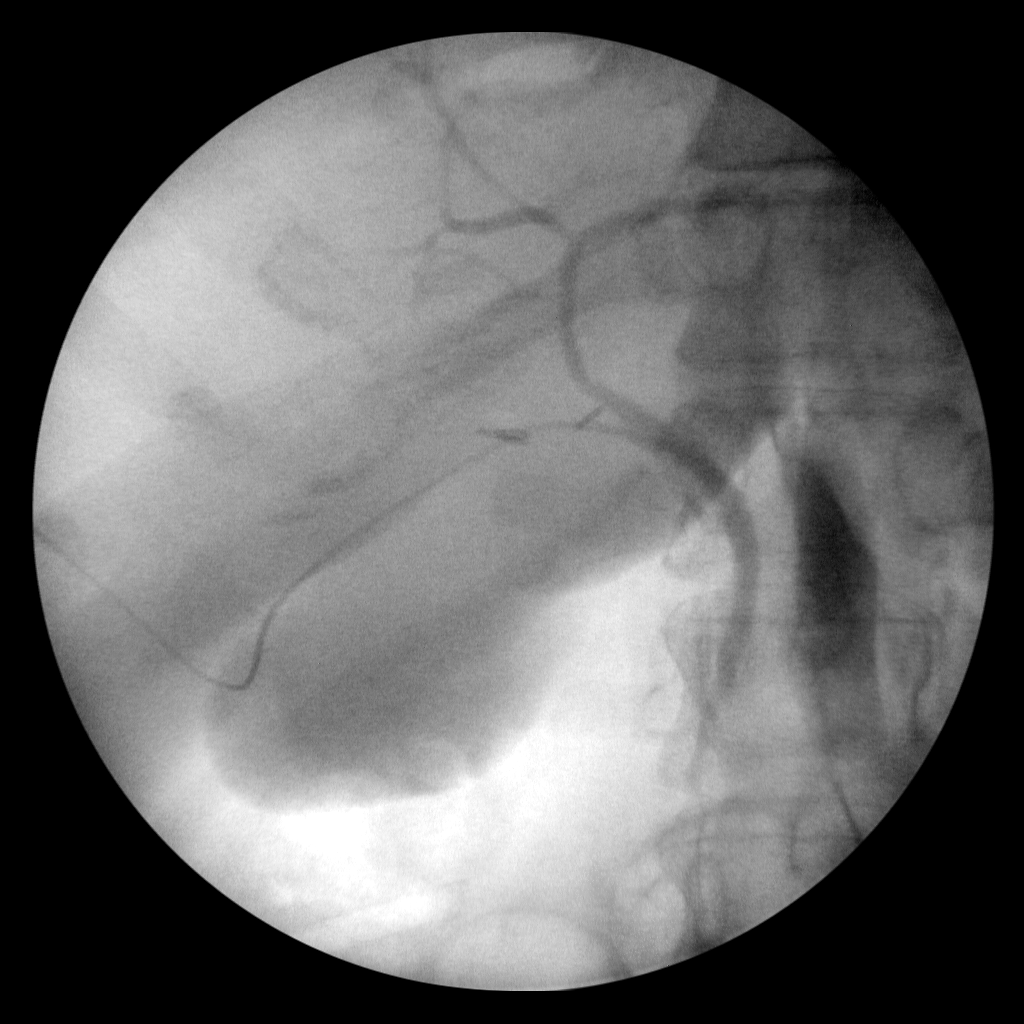
[frame 31/60]
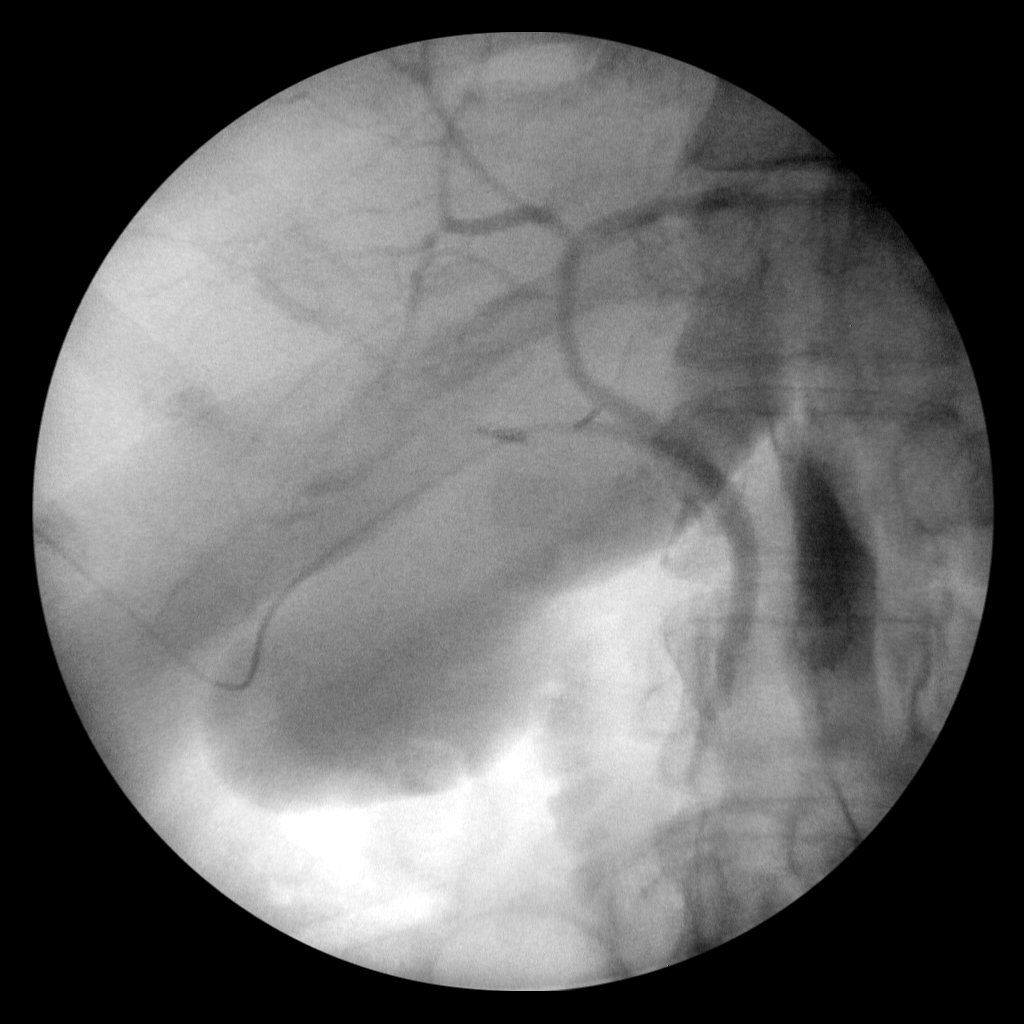
[frame 52/60]
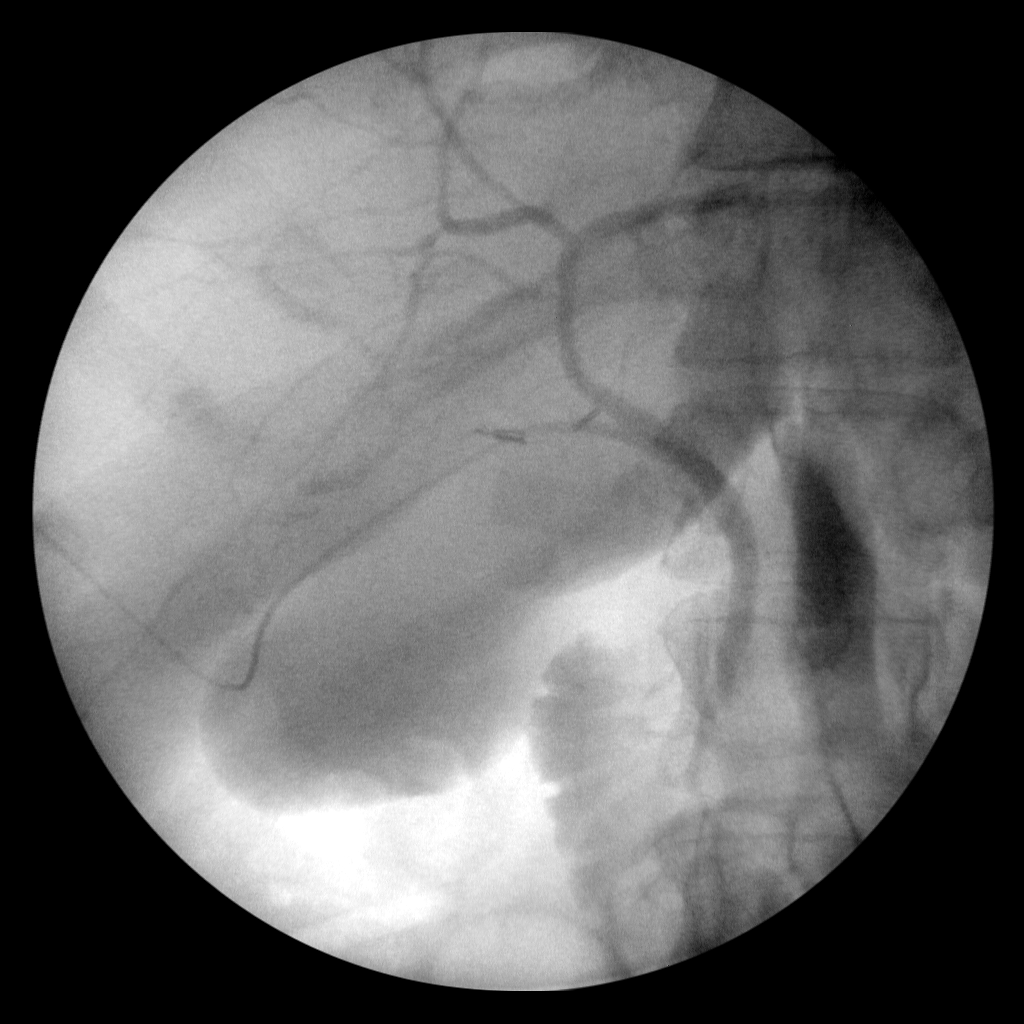
[frame 58/60]
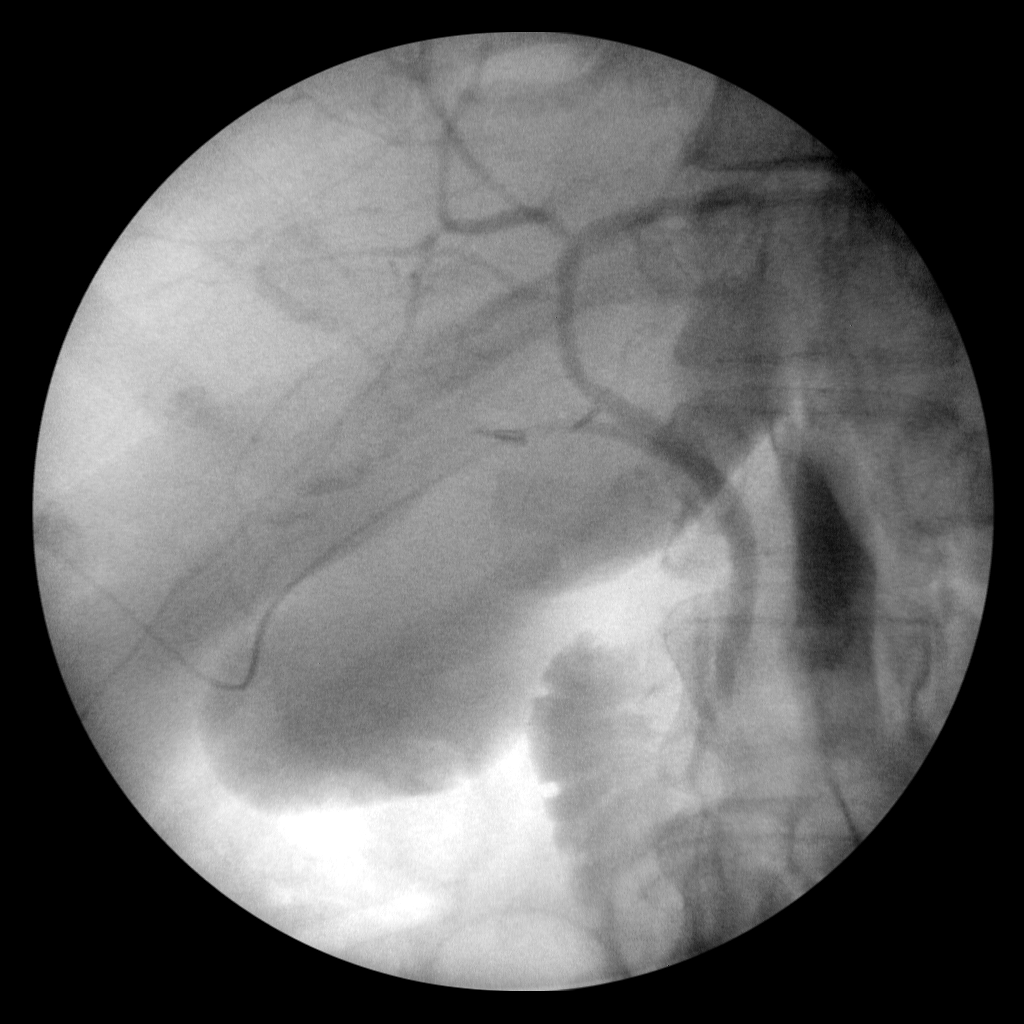

[4 of 4 positions shown; findings below may reference images not displayed]

FINDINGS: Surgical instruments project over the upper abdomen.

There is cannulation of the cystic duct/gallbladder neck, with
antegrade infusion of contrast. Caliber of the extrahepatic ductal
system within normal limits.

No definite filling defect within the extrahepatic ducts identified.

Free flow of contrast across the ampulla.
IMPRESSION: Intraoperative cholangiogram demonstrates extrahepatic biliary ducts
of unremarkable caliber, with no definite filling defects
identified. Free flow of contrast across the ampulla.

Please refer to the dictated operative report for full details of
intraoperative findings and procedure

## 2019-06-07 DIAGNOSIS — G510 Bell's palsy: Secondary | ICD-10-CM

## 2019-06-07 HISTORY — DX: Bell's palsy: G51.0

## 2019-06-08 ENCOUNTER — Emergency Department (HOSPITAL_COMMUNITY)
Admission: EM | Admit: 2019-06-08 | Discharge: 2019-06-08 | Disposition: A | Discharge status: Home / Self Care | Payer: BC Managed Care – PPO | Attending: Emergency Medicine | Admitting: Emergency Medicine

## 2019-06-08 ENCOUNTER — Other Ambulatory Visit: Payer: Self-pay

## 2019-06-08 ENCOUNTER — Telehealth: Payer: Self-pay | Admitting: *Deleted

## 2019-06-08 ENCOUNTER — Encounter (HOSPITAL_COMMUNITY): Payer: Self-pay | Admitting: Emergency Medicine

## 2019-06-08 DIAGNOSIS — Z79899 Other long term (current) drug therapy: Secondary | ICD-10-CM | POA: Diagnosis not present

## 2019-06-08 DIAGNOSIS — J45909 Unspecified asthma, uncomplicated: Secondary | ICD-10-CM | POA: Diagnosis not present

## 2019-06-08 DIAGNOSIS — G510 Bell's palsy: Secondary | ICD-10-CM | POA: Diagnosis not present

## 2019-06-08 DIAGNOSIS — R29810 Facial weakness: Secondary | ICD-10-CM | POA: Diagnosis present

## 2019-06-08 MED ORDER — VALACYCLOVIR HCL 500 MG PO TABS
1000.0000 mg | ORAL_TABLET | Freq: Once | ORAL | Status: AC
  Administered 2019-06-08: 1000 mg via ORAL
  Filled 2019-06-08: qty 2

## 2019-06-08 MED ORDER — PREDNISONE 10 MG PO TABS: 60.0000 mg | ORAL_TABLET | Freq: Every day | ORAL | 0 refills | Status: DC

## 2019-06-08 MED ORDER — PREDNISONE 20 MG PO TABS
60.0000 mg | ORAL_TABLET | Freq: Once | ORAL | Status: AC
  Administered 2019-06-08: 60 mg via ORAL
  Filled 2019-06-08: qty 3

## 2019-06-08 MED ORDER — VALACYCLOVIR HCL 1 G PO TABS: 1000.0000 mg | ORAL_TABLET | Freq: Three times a day (TID) | ORAL | 0 refills | Status: AC

## 2019-06-08 NOTE — ED Triage Notes (Signed)
Patient in POV, presents with L sided facial droop onset this AM when waking. LKW 8PM last night before she went to bed. No other neuro deficits. EDP at bedside

## 2019-06-08 NOTE — ED Notes (Signed)
Patient verbalizes understanding of discharge instructions. Opportunity for questioning and answers were provided. Armband removed by staff, pt discharged from ED.  

## 2019-06-08 NOTE — ED Provider Notes (Signed)
TIME SEEN: 6:15 AM  CHIEF COMPLAINT: Left-sided facial paralysis  HPI: Patient is a 49 year old female with history of asthma, obesity who presents to the emergency department with left-sided facial paralysis that was present when she woke up this morning.  Went to bed last night at 8 PM and was feeling normal.  No numbness or other weakness.  No fever, current headache, rash.  Has never had Bell's palsy before.  ROS: See HPI Constitutional: no fever  Eyes: no drainage  ENT: no runny nose   Cardiovascular:  no chest pain  Resp: no SOB  GI: no vomiting GU: no dysuria Integumentary: no rash  Allergy: no hives  Musculoskeletal: no leg swelling  Neurological: no slurred speech ROS otherwise negative  PAST MEDICAL HISTORY/PAST SURGICAL HISTORY:  Past Medical History:  Diagnosis Date  . Asthma   . Environmental and seasonal allergies     MEDICATIONS:  Prior to Admission medications   Medication Sig Start Date End Date Taking? Authorizing Provider  acetaminophen (TYLENOL) 325 MG tablet Take 2 tablets (650 mg total) by mouth every 4 (four) hours. 04/02/17   Adam Phenix, PA-C  albuterol (PROVENTIL HFA;VENTOLIN HFA) 108 (90 Base) MCG/ACT inhaler Inhale 1-2 puffs into the lungs every 6 (six) hours as needed for wheezing or shortness of breath.    [provider]  cetirizine (ZYRTEC) 10 MG tablet Take 10 mg by mouth at bedtime.     [provider]  cholecalciferol (VITAMIN D) 1000 units tablet Take 1,000 Units by mouth 2 (two) times daily.    [provider]  cimetidine (TAGAMET) 400 MG tablet Take 1 tablet by mouth 3 (three) times daily. 03/13/17   [provider]  loratadine (CLARITIN) 10 MG tablet Take 10 mg by mouth every morning.    [provider]    ALLERGIES:  Allergies  Allergen Reactions  . Indomethacin Other (See Comments)    Causes asthma to flare up    SOCIAL HISTORY:  Social History   Tobacco Use  . Smoking status:  Never Smoker  . Smokeless tobacco: Never Used  Substance Use Topics  . Alcohol use: No    FAMILY HISTORY: No family history on file.  EXAM: BP (!) 156/91 (BP Location: Left Arm)   Pulse 80   Temp 97.7 F (36.5 C) (Oral)   Resp 18   Ht 5\' 10"  (1.778 m)   Wt (!) 149.7 kg   SpO2 99%   BMI 47.35 kg/m  CONSTITUTIONAL: Alert and oriented and responds appropriately to questions. Well-appearing; well-nourished HEAD: Normocephalic, no rash noted to the scalp or face EYES: Conjunctivae clear, pupils appear equal, EOM appear intact ENT: normal nose; moist mucous membranes; left TM appears normal without erythema, bulging, effusion, no lesions noted within the external auditory canal and no cerumen impaction or foreign body NECK: Supple, normal ROM CARD: RRR; S1 and S2 appreciated; no murmurs, no clicks, no rubs, no gallops RESP: Normal chest excursion without splinting or tachypnea; breath sounds clear and equal bilaterally; no wheezes, no rhonchi, no rales, no hypoxia or respiratory distress, speaking full sentences ABD/GI: Normal bowel sounds; non-distended; soft, non-tender, no rebound, no guarding, no peritoneal signs, no hepatosplenomegaly BACK:  The back appears normal EXT: Normal ROM in all joints; no deformity noted, no edema; no cyanosis SKIN: Normal color for age and race; warm; no rash on exposed skin NEURO: Moves all extremities equally, strength 5/5 in all 4 extremities, sensation to light touch intact diffusely, patient has cranial  nerve VII paralysis (unable to lift eyebrow on the left side, asymmetric smile, able to close left eyelid but not tightly as compared to the right) on the left side but otherwise cranial nerves are intact and normal movement of the right side of the face PSYCH: The patient's mood and manner are appropriate.   MEDICAL DECISION MAKING: Patient here with Bell's palsy.  No sign of Ramsay Hunt.  No other focal neurologic deficits.  Will discharge with  prednisone, Valtrex.  She has outpatient PCP follow-up.  Discussed with patient that I am not concerned for intracranial hemorrhage, CVA, TIA, intracranial mass, CVT at this time.  Patient appears comfortable with outpatient management.  Discussed return precautions and supportive care instructions.  At this time she is able to close her eye and does not need an eye patch.  At this time, I do not feel there is any life-threatening condition present. I have reviewed, interpreted and discussed all results (EKG, imaging, lab, urine as appropriate) and exam findings with patient/family. I have reviewed nursing notes and appropriate previous records.  I feel the patient is safe to be discharged home without further emergent workup and can continue workup as an outpatient as needed. Discussed usual and customary return precautions. Patient/family verbalize understanding and are comfortable with this plan.  Outpatient follow-up has been provided as needed. All questions have been answered.    Karen Mendoza was evaluated in Emergency Department on 06/08/2019 for the symptoms described in the history of present illness. She was evaluated in the context of the global COVID-19 pandemic, which necessitated consideration that the patient might be at risk for infection with the SARS-CoV-2 virus that causes COVID-19. Institutional protocols and algorithms that pertain to the evaluation of patients at risk for COVID-19 are in a state of rapid change based on information released by regulatory bodies including the CDC and federal and state organizations. These policies and algorithms were followed during the patient's care in the ED.      Karen Mendoza, Karen Bison, DO 06/08/19 [phone]

## 2019-06-08 NOTE — Telephone Encounter (Signed)
Pharmacy called related to Rx: prednisone quantity...EDCM clarified with EDP to change Rx to: #36.

## 2019-06-17 DIAGNOSIS — R21 Rash and other nonspecific skin eruption: Secondary | ICD-10-CM | POA: Diagnosis not present

## 2019-06-17 DIAGNOSIS — G510 Bell's palsy: Secondary | ICD-10-CM | POA: Diagnosis not present

## 2019-06-24 DIAGNOSIS — L508 Other urticaria: Secondary | ICD-10-CM | POA: Diagnosis not present

## 2019-06-24 DIAGNOSIS — I872 Venous insufficiency (chronic) (peripheral): Secondary | ICD-10-CM | POA: Diagnosis not present

## 2019-06-24 DIAGNOSIS — B880 Other acariasis: Secondary | ICD-10-CM | POA: Diagnosis not present

## 2019-08-05 ENCOUNTER — Other Ambulatory Visit (HOSPITAL_COMMUNITY)
Admission: RE | Admit: 2019-08-05 | Discharge: 2019-08-05 | Disposition: A | Discharge status: Home / Self Care | Payer: BC Managed Care – PPO | Source: Ambulatory Visit | Attending: Family Medicine | Admitting: Family Medicine

## 2019-08-05 DIAGNOSIS — Z0000 Encounter for general adult medical examination without abnormal findings: Secondary | ICD-10-CM | POA: Diagnosis not present

## 2019-08-05 DIAGNOSIS — Z13220 Encounter for screening for lipoid disorders: Secondary | ICD-10-CM | POA: Diagnosis not present

## 2019-08-05 DIAGNOSIS — E559 Vitamin D deficiency, unspecified: Secondary | ICD-10-CM | POA: Diagnosis not present

## 2019-08-05 DIAGNOSIS — Z124 Encounter for screening for malignant neoplasm of cervix: Secondary | ICD-10-CM | POA: Insufficient documentation

## 2019-08-05 DIAGNOSIS — Z23 Encounter for immunization: Secondary | ICD-10-CM | POA: Diagnosis not present

## 2019-08-05 DIAGNOSIS — I10 Essential (primary) hypertension: Secondary | ICD-10-CM | POA: Diagnosis not present

## 2019-08-10 LAB — CYTOLOGY - PAP
Comment: NEGATIVE
Diagnosis: NEGATIVE
High risk HPV: NEGATIVE

## 2019-10-06 ENCOUNTER — Encounter: Payer: Self-pay | Admitting: *Deleted

## 2019-10-07 ENCOUNTER — Encounter: Payer: Self-pay | Admitting: Neurology

## 2019-10-07 ENCOUNTER — Ambulatory Visit (INDEPENDENT_AMBULATORY_CARE_PROVIDER_SITE_OTHER): Payer: BC Managed Care – PPO | Admitting: Neurology

## 2019-10-07 ENCOUNTER — Other Ambulatory Visit: Payer: Self-pay

## 2019-10-07 DIAGNOSIS — L820 Inflamed seborrheic keratosis: Secondary | ICD-10-CM | POA: Diagnosis not present

## 2019-10-07 DIAGNOSIS — G510 Bell's palsy: Secondary | ICD-10-CM | POA: Diagnosis not present

## 2019-10-07 DIAGNOSIS — C44311 Basal cell carcinoma of skin of nose: Secondary | ICD-10-CM | POA: Diagnosis not present

## 2019-10-07 DIAGNOSIS — S50861A Insect bite (nonvenomous) of right forearm, initial encounter: Secondary | ICD-10-CM | POA: Diagnosis not present

## 2019-10-07 HISTORY — DX: Bell's palsy: G51.0

## 2019-10-07 HISTORY — DX: Morbid (severe) obesity due to excess calories: E66.01

## 2019-10-07 NOTE — Progress Notes (Signed)
Reason for visit: Bell's palsy, left  Referring physician: Dr. Dagoberto Reef is a 49 y.o. female  History of present illness:  Karen Mendoza is a 49 year old right-handed white female with history of morbid obesity and asthma who comes to the office today for evaluation of a left Bell's palsy that began around 06/08/2019.  The patient had some twitching around the left eye the day before but woke up with severe weakness of the left facial nerve the next day.  The patient has not had much improvement at all since onset with exception that she is able to close the left eye a little bit better.  She is not having any sensation of dryness of the left cornea or tearing of the eye.  She reported some mild achiness in front of the ear on the left, but she never had any change in taste sensation or altered hearing, loud noises are not particularly bothersome to her.  The patient denies any numbness or weakness elsewhere in the body, she denies any problems with double vision or loss of vision.  She has not had any problems swallowing.  She has not had any changes in balance or difficulty controlling the bowels or the bladder.  There have been no problems with dizziness or headache.  Due to the persistence of symptoms, the patient was sent to this office for an evaluation.  Past Medical History:  Diagnosis Date  . Asthma   . Bell's palsy 06/2019  . Environmental and seasonal allergies   . GERD (gastroesophageal reflux disease)   . Vitamin D deficiency     Past Surgical History:  Procedure Laterality Date  . APPENDECTOMY    . BACK SURGERY    . CHOLECYSTECTOMY N/A 04/01/2017   Procedure: LAPAROSCOPIC CHOLECYSTECTOMY WITH INTRAOPERATIVE CHOLANGIOGRAM;  Surgeon: Gaynelle Adu, MD;  Location: [facility];  Service: General;  Laterality: N/A;  . LUMBAR DISC SURGERY  2009  . TUBAL LIGATION      Family History  Problem Relation Age of Onset  . Hypercholesterolemia Mother   . Skin cancer Mother    . Colon cancer Father   . Colon cancer Maternal Grandmother   . Stroke Maternal Grandmother   . Diabetes Paternal Grandmother   . Prostate cancer Paternal Grandfather   . Lung cancer Paternal Grandfather     Social history:  reports that she has never smoked. She has never used smokeless tobacco. She reports that she does not drink alcohol and does not use drugs.  Medications:  Prior to Admission medications   Medication Sig Start Date End Date Taking? Authorizing Provider  acetaminophen (TYLENOL) 325 MG tablet Take 2 tablets (650 mg total) by mouth every 4 (four) hours. 04/02/17  Yes Simaan, Francine Graven, PA-C  albuterol (PROVENTIL HFA;VENTOLIN HFA) 108 (90 Base) MCG/ACT inhaler Inhale 1-2 puffs into the lungs every 6 (six) hours as needed for wheezing or shortness of breath.   Yes [provider]  cetirizine (ZYRTEC) 10 MG tablet Take 10 mg by mouth at bedtime.    Yes [provider]  cholecalciferol (VITAMIN D) 1000 units tablet Take 1,000 Units by mouth 2 (two) times daily.   Yes [provider]  esomeprazole (NEXIUM) 20 MG capsule Take 20 mg by mouth daily at 12 noon.   Yes [provider]  fluticasone furoate-vilanterol (BREO ELLIPTA) 100-25 MCG/INH AEPB Inhale 1 puff into the lungs daily.   Yes [provider]  hydrOXYzine (ATARAX/VISTARIL) 25 MG tablet Take  25-50 mg by mouth at bedtime.    Yes [provider]  levocetirizine (XYZAL) 5 MG tablet Take 5 mg by mouth every evening.   Yes [provider]      Allergies  Allergen Reactions  . Red Dye Hives  . Indomethacin Other (See Comments)    Causes asthma to flare up    ROS:  Out of a complete 14 system review of symptoms, the patient complains only of the following symptoms, and all other reviewed systems are negative.  Left facial weakness  Blood pressure (!) 148/82, pulse 70, height 5\' 10"  (1.778 m), weight (!) 343 lb (155.6 kg).  Physical Exam  General:  The patient is alert and cooperative at the time of the examination.  The patient is morbidly obese.  Eyes: Pupils are equal, round, and reactive to light. Discs are flat bilaterally.   Ears: Tympanic membranes are clear bilaterally.  Neck: The neck is supple, no carotid bruits are noted.  Respiratory: The respiratory examination is clear.  Cardiovascular: The cardiovascular examination reveals a regular rate and rhythm, no obvious murmurs or rubs are noted.  Skin: Extremities are without significant edema.  Neurologic Exam  Mental status: The patient is alert and oriented x 3 at the time of the examination. The patient has apparent normal recent and remote memory, with an apparently normal attention span and concentration ability.  Cranial nerves: Facial symmetry is not present.  The patient has significant weakness of the left face, inability to visibly contract muscles on the left face, she is able to close her eyes with full effort but the eyelid on the left does not close completely with regular blinking. There is good sensation of the face to pinprick and soft touch bilaterally. The strength of the muscles to head turning and shoulder shrug are normal bilaterally. Speech is well enunciated, no aphasia or dysarthria is noted. Extraocular movements are full. Visual fields are full. The tongue is midline, and the patient has symmetric elevation of the soft palate. No obvious hearing deficits are noted.  Motor: The motor testing reveals 5 over 5 strength of all 4 extremities. Good symmetric motor tone is noted throughout.  Sensory: Sensory testing is intact to pinprick, soft touch, vibration sensation, and position sense on all 4 extremities. No evidence of extinction is noted.  Coordination: Cerebellar testing reveals good finger-nose-finger and heel-to-shin bilaterally.  Gait and station: Gait is normal. Tandem gait is normal. Romberg is negative. No drift is seen.  Reflexes: Deep  tendon reflexes are symmetric and normal bilaterally. Toes are downgoing bilaterally.   Assessment/Plan:  1.  Severe left Bell's palsy  2.  Morbid obesity  The patient has had no improvement over the last 4 months with the Bell's palsy.  It is still possible that the facial weakness may improve, but the patient may be at risk for aberrant regeneration.  A small portion of individuals with Bell's palsy get no improvement.  The patient be sent for blood work looking for other etiologies of Bell's palsy, if the patient has had no improvement whatsoever by early December 2021, she will call our office and we will set up MRI evaluation of the brain with and without gadolinium enhancement.  The patient will otherwise follow-up in 3 months.  09-20-1988 MD 10/07/2019 8:15 AM  [facility] [address] [place], [place]  Phone [phone] Fax [phone]

## 2019-10-10 LAB — SEDIMENTATION RATE: Sed Rate: 54 mm/hr — ABNORMAL HIGH (ref 0–32)

## 2019-10-10 LAB — HEMOGLOBIN A1C
Est. average glucose Bld gHb Est-mCnc: 137 mg/dL
Hgb A1c MFr Bld: 6.4 % — ABNORMAL HIGH (ref 4.8–5.6)

## 2019-10-10 LAB — VITAMIN B12: Vitamin B-12: 433 pg/mL (ref 232–1245)

## 2019-10-10 LAB — B. BURGDORFI ANTIBODIES: Lyme IgG/IgM Ab: <0.91 {ISR} (ref 0.00–0.90)

## 2019-10-10 LAB — ANGIOTENSIN CONVERTING ENZYME: Angio Convert Enzyme: 29 U/L (ref 14–82)

## 2019-10-10 LAB — ANA W/REFLEX: Anti Nuclear Antibody (ANA): NEGATIVE

## 2019-10-12 ENCOUNTER — Telehealth: Payer: Self-pay

## 2019-10-12 NOTE — Telephone Encounter (Signed)
Pt verified by name and DOB,  normal results given per provider, pt voiced understanding all question answered. °

## 2019-10-12 NOTE — Telephone Encounter (Signed)
-----   Message from York Spaniel, MD sent at 10/11/2019 12:17 PM EDT ----- Blood work reveals an elevation in sedimentation rate, will follow.  The patient has an elevated hemoglobin A1c level suggestive of borderline diabetes, could be a risk factor in developing Shalana Jardin's palsy.  An appropriate low glycemic diet combined with exercise should be undertaken.  Please call the patient. ----- Message ----- From: Nell Range Lab Results In Sent: 10/08/2019   7:37 AM EDT To: York Spaniel, MD

## 2019-10-24 ENCOUNTER — Other Ambulatory Visit: Payer: Self-pay | Admitting: Family Medicine

## 2019-10-24 DIAGNOSIS — Z1231 Encounter for screening mammogram for malignant neoplasm of breast: Secondary | ICD-10-CM

## 2019-10-28 DIAGNOSIS — Z6841 Body Mass Index (BMI) 40.0 and over, adult: Secondary | ICD-10-CM | POA: Diagnosis not present

## 2019-10-28 DIAGNOSIS — M25562 Pain in left knee: Secondary | ICD-10-CM | POA: Diagnosis not present

## 2019-11-04 DIAGNOSIS — M25561 Pain in right knee: Secondary | ICD-10-CM | POA: Diagnosis not present

## 2019-11-04 DIAGNOSIS — M25362 Other instability, left knee: Secondary | ICD-10-CM | POA: Diagnosis not present

## 2019-11-04 DIAGNOSIS — M6281 Muscle weakness (generalized): Secondary | ICD-10-CM | POA: Diagnosis not present

## 2019-11-04 DIAGNOSIS — R262 Difficulty in walking, not elsewhere classified: Secondary | ICD-10-CM | POA: Diagnosis not present

## 2019-11-11 DIAGNOSIS — Z85828 Personal history of other malignant neoplasm of skin: Secondary | ICD-10-CM | POA: Diagnosis not present

## 2019-11-11 DIAGNOSIS — M6281 Muscle weakness (generalized): Secondary | ICD-10-CM | POA: Diagnosis not present

## 2019-11-11 DIAGNOSIS — M25362 Other instability, left knee: Secondary | ICD-10-CM | POA: Diagnosis not present

## 2019-11-11 DIAGNOSIS — Z08 Encounter for follow-up examination after completed treatment for malignant neoplasm: Secondary | ICD-10-CM | POA: Diagnosis not present

## 2019-11-11 DIAGNOSIS — R262 Difficulty in walking, not elsewhere classified: Secondary | ICD-10-CM | POA: Diagnosis not present

## 2019-11-11 DIAGNOSIS — M25561 Pain in right knee: Secondary | ICD-10-CM | POA: Diagnosis not present

## 2019-11-18 ENCOUNTER — Ambulatory Visit
Admission: RE | Admit: 2019-11-18 | Discharge: 2019-11-18 | Disposition: A | Discharge status: Home / Self Care | Payer: BC Managed Care – PPO | Source: Ambulatory Visit | Attending: Family Medicine | Admitting: Family Medicine

## 2019-11-18 ENCOUNTER — Other Ambulatory Visit: Payer: Self-pay

## 2019-11-18 DIAGNOSIS — Z1231 Encounter for screening mammogram for malignant neoplasm of breast: Secondary | ICD-10-CM | POA: Diagnosis not present

## 2019-12-09 DIAGNOSIS — M25561 Pain in right knee: Secondary | ICD-10-CM | POA: Diagnosis not present

## 2019-12-09 DIAGNOSIS — M25362 Other instability, left knee: Secondary | ICD-10-CM | POA: Diagnosis not present

## 2019-12-09 DIAGNOSIS — R262 Difficulty in walking, not elsewhere classified: Secondary | ICD-10-CM | POA: Diagnosis not present

## 2019-12-09 DIAGNOSIS — M6281 Muscle weakness (generalized): Secondary | ICD-10-CM | POA: Diagnosis not present

## 2020-01-04 IMAGING — US US ABDOMEN COMPLETE
1 series · 13 of 25 positions shown · non-contrast
Comparison: None.

CLINICAL DATA: Initial evaluation for acute right upper quadrant
pain, positive Murphy's sign.

EXAM:
ABDOMEN ULTRASOUND COMPLETE

[Series 1: us abdomen complete · 0.20mm/px · 13 of 57 slices shown]
[im 1/57]
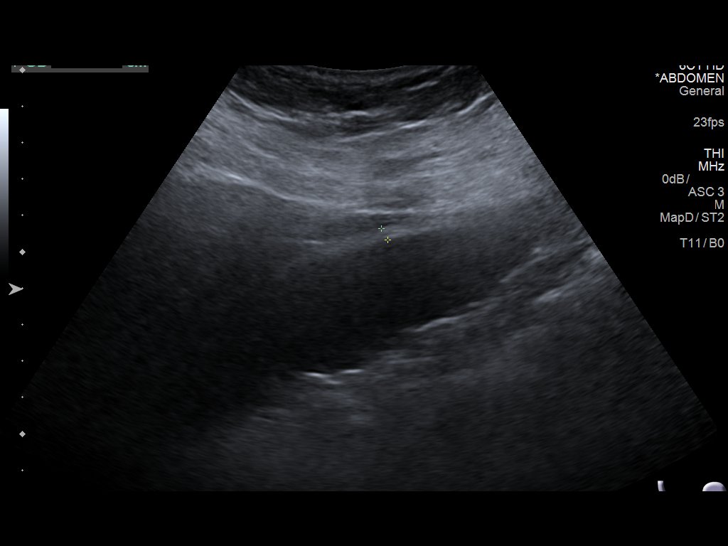
[im 5/57]
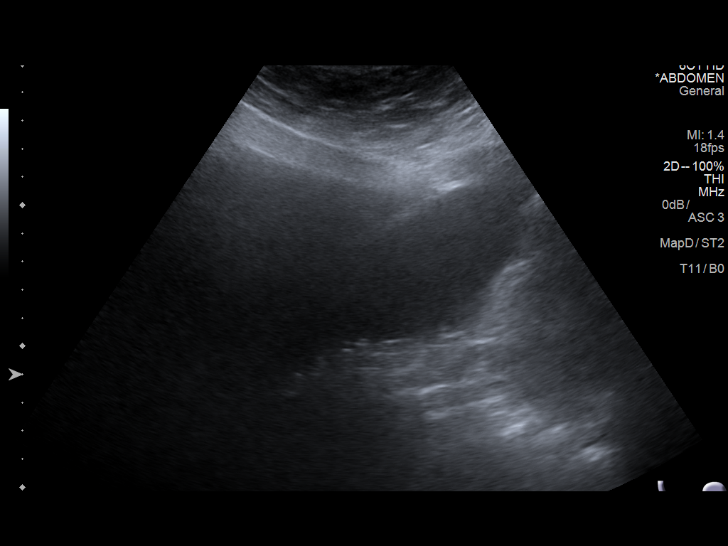
[im 10/57]
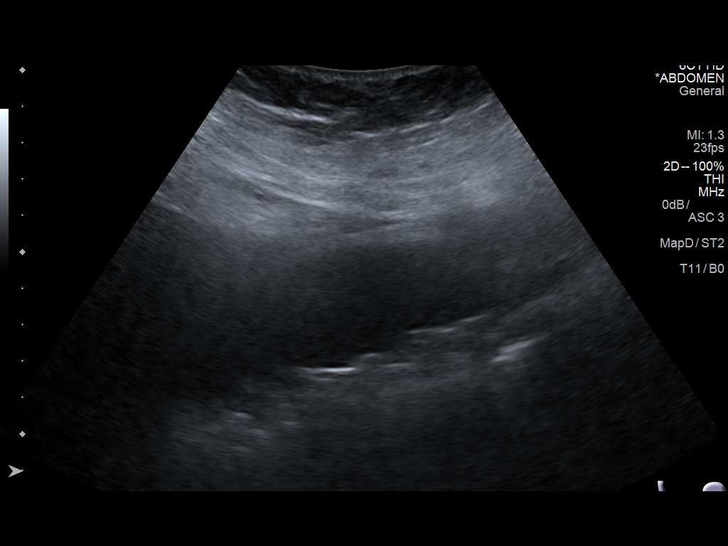
[im 15/57]
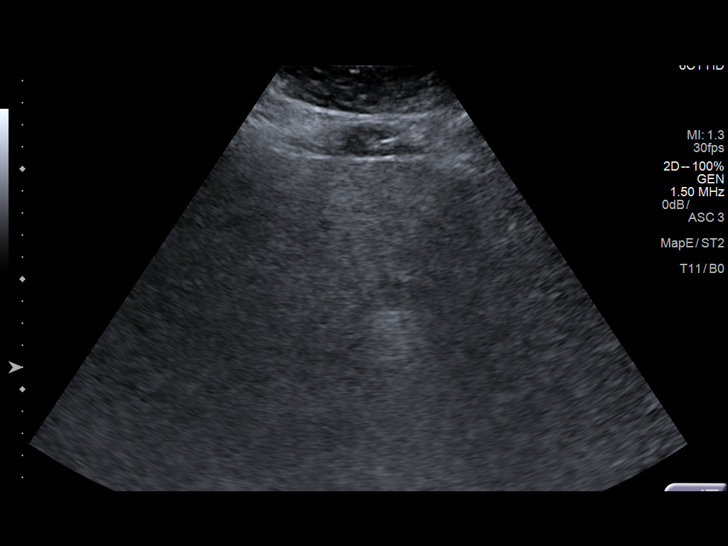
[im 19/57]
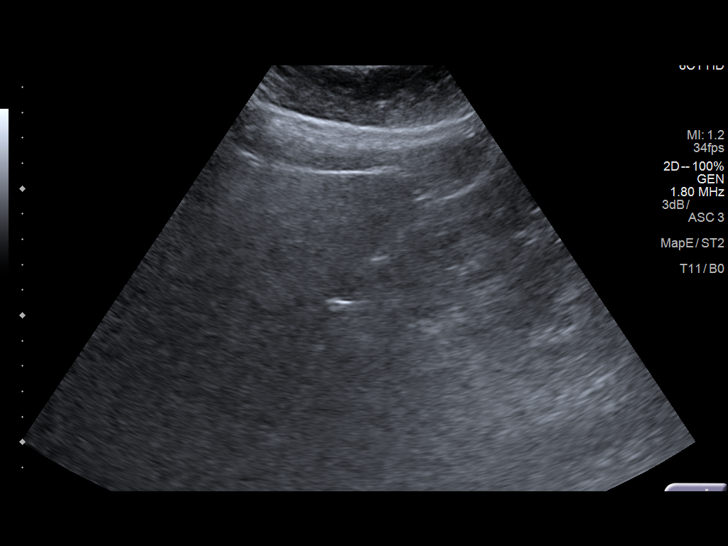
[im 24/57]
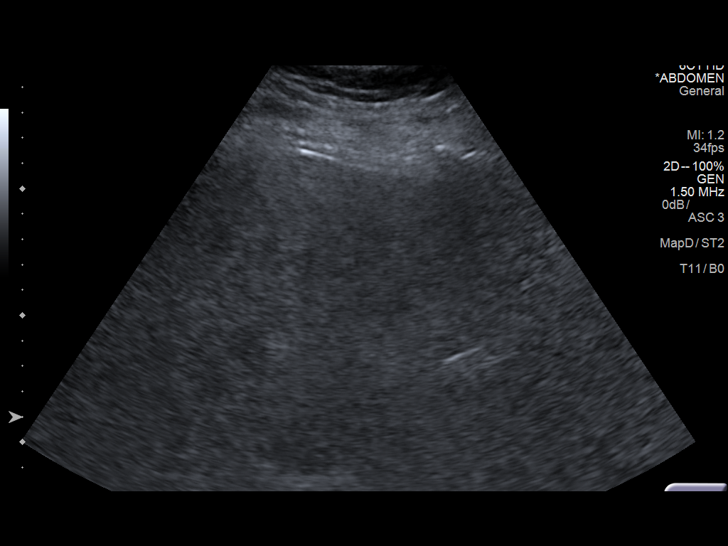
[im 29/57]
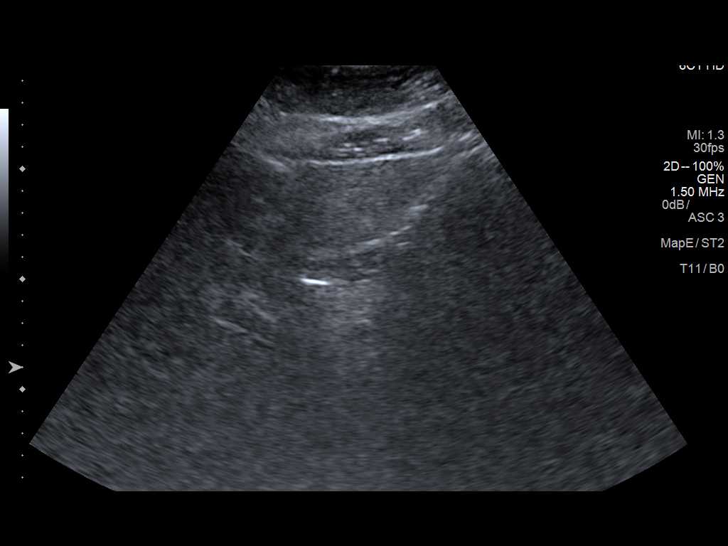
[im 33/57]
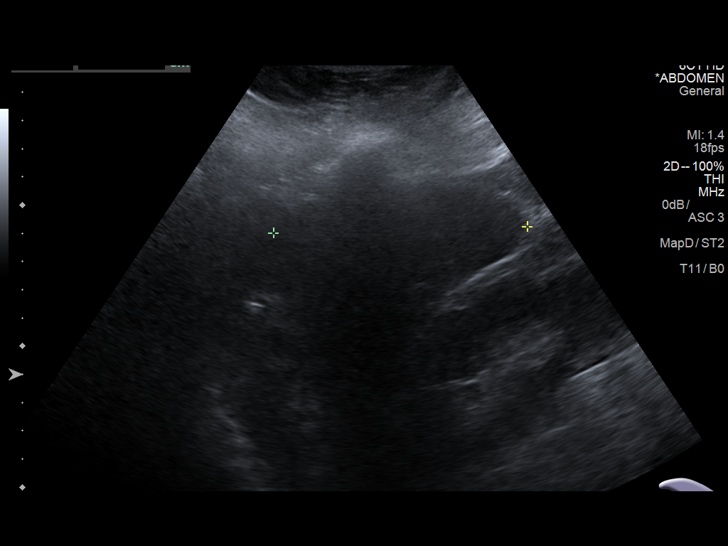
[im 38/57]
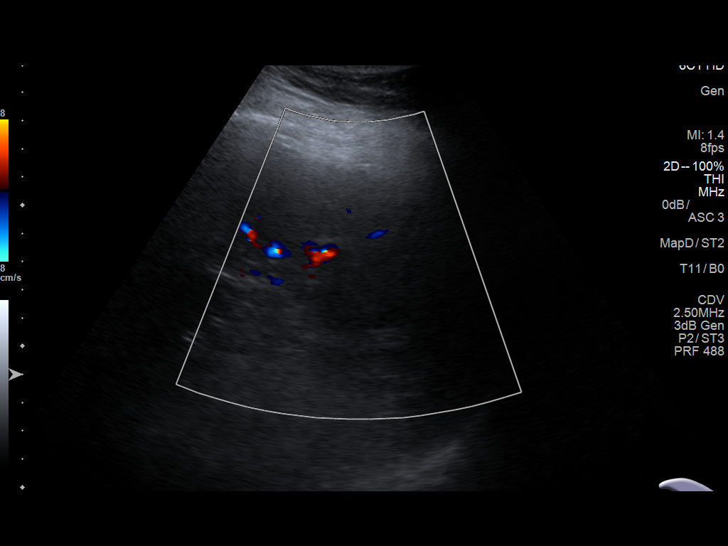
[im 43/57]
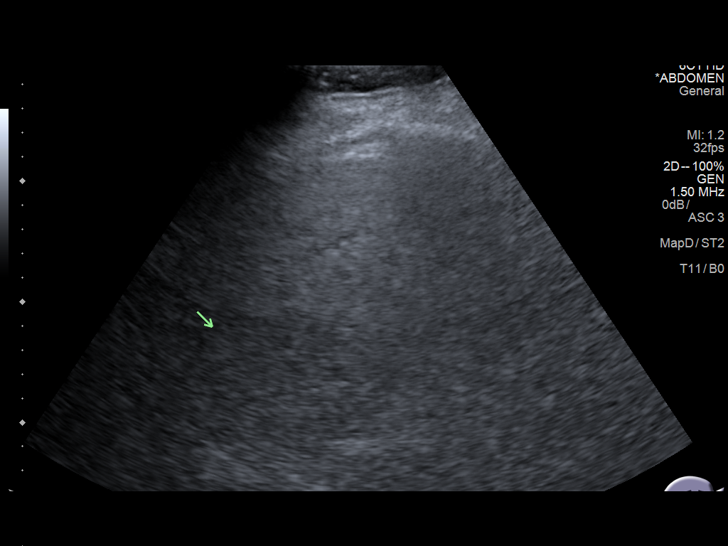
[im 47/57]
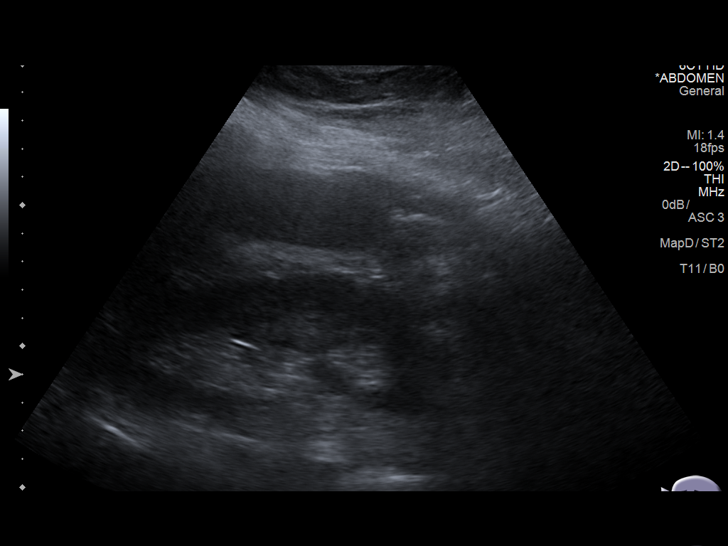
[im 52/57]
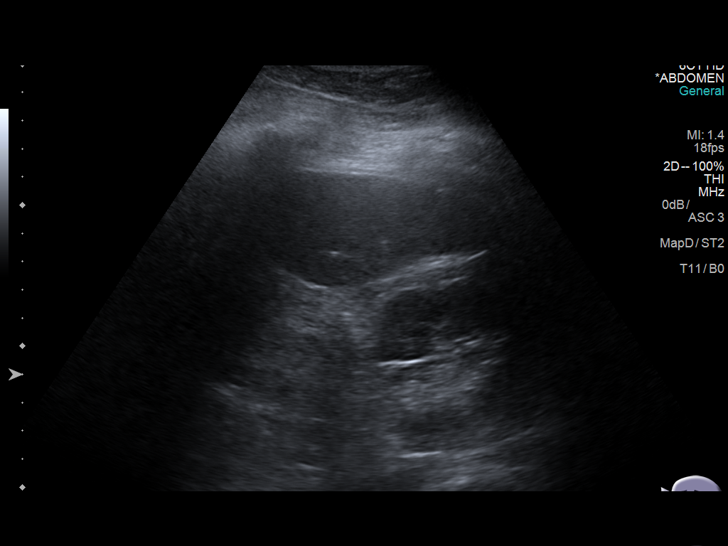
[im 57/57]
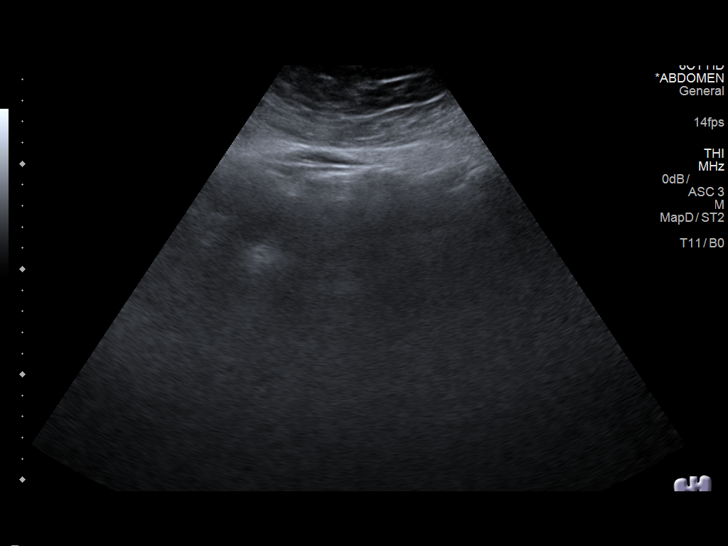

[13 of 25 positions shown; findings below may reference images not displayed]

FINDINGS: Gallbladder: Gallbladder is distended measuring up to approximately
10 cm. Internal stones and sludge present within the gallbladder
lumen. Gallbladder wall mildly thickened up to 4 mm with small
amount of free pericholecystic fluid. Sonographic Murphy sign was
elicited on exam.

Common bile duct: Diameter: Not visualized.

Liver: No focal lesion identified. Increased echogenicity,
suggesting steatosis. Portal vein is patent on color Doppler imaging
with normal direction of blood flow towards the liver.

IVC: Not visualized.

Pancreas: Poorly visualized due to body habitus.

Spleen: Size and appearance within normal limits.

Right Kidney: Length: 12.1 cm. Echogenicity within normal limits. No
mass or hydronephrosis visualized.

Left Kidney: Length: 14.4 cm. Echogenicity within normal limits. No
mass or hydronephrosis visualized.

Abdominal aorta: No aneurysm visualized.

Other findings: None.
IMPRESSION: 1. Distended gallbladder with internal stones and sludge, mild
gallbladder wall thickening, with positive sonographic Murphy sign.
Clinical correlation for possible acute cholecystitis recommended.
2. Evaluation of the biliary tree limited on this exam due to body
habitus.

## 2020-01-12 DIAGNOSIS — H00014 Hordeolum externum left upper eyelid: Secondary | ICD-10-CM | POA: Diagnosis not present

## 2020-01-16 ENCOUNTER — Other Ambulatory Visit: Payer: Self-pay

## 2020-01-16 ENCOUNTER — Encounter: Payer: Self-pay | Admitting: Neurology

## 2020-01-16 ENCOUNTER — Ambulatory Visit (INDEPENDENT_AMBULATORY_CARE_PROVIDER_SITE_OTHER): Payer: BC Managed Care – PPO | Admitting: Neurology

## 2020-01-16 VITALS — BP 151/93 | HR 94 | Ht 70.0 in | Wt 330.0 lb

## 2020-01-16 DIAGNOSIS — G510 Bell's palsy: Secondary | ICD-10-CM

## 2020-01-16 NOTE — Progress Notes (Signed)
PATIENT: Karen Mendoza DOB: 11-15-1970  REASON FOR VISIT: follow up HISTORY FROM: patient  HISTORY OF PRESENT ILLNESS: Today 01/16/20 Karen Mendoza is a 50 year old female with a history of left-sided Bell's palsy since June 2021.  Laboratory evaluation showed an elevation in sedimentation rate 54, elevated A1c 6.4.  Has yet to have much improvement.  Since initial event, drooping to the left side has improved at baseline, but continues to be notable with smile. Cannot completely close left eyelid with blinking.  Some trouble with chewing on the left side, food gets caught on the left side.  No weakness to the arms or legs.  No sensation of dryness to the left cornea. Works in the [employer] at a [employer].  Here today for follow-up unaccompanied.  HISTORY  10/07/2019 Dr. Anne Hahn: Karen Mendoza is a 50 year old right-handed white female with history of morbid obesity and asthma who comes to the office today for evaluation of a left Bell's palsy that began around 06/08/2019.  The patient had some twitching around the left eye the day before but woke up with severe weakness of the left facial nerve the next day.  The patient has not had much improvement at all since onset with exception that she is able to close the left eye a little bit better.  She is not having any sensation of dryness of the left cornea or tearing of the eye.  She reported some mild achiness in front of the ear on the left, but she never had any change in taste sensation or altered hearing, loud noises are not particularly bothersome to her.  The patient denies any numbness or weakness elsewhere in the body, she denies any problems with double vision or loss of vision.  She has not had any problems swallowing.  She has not had any changes in balance or difficulty controlling the bowels or the bladder.  There have been no problems with dizziness or headache.  Due to the persistence of symptoms, the patient was sent to this  office for an evaluation.  REVIEW OF SYSTEMS: Out of a complete 14 system review of symptoms, the patient complains only of the following symptoms, and all other reviewed systems are negative.   Bell's palsy  ALLERGIES: Allergies  Allergen Reactions  . Red Dye Hives  . Indomethacin Other (See Comments)    Causes asthma to flare up    HOME MEDICATIONS: Outpatient Medications Prior to Visit  Medication Sig Dispense Refill  . albuterol (PROVENTIL HFA;VENTOLIN HFA) 108 (90 Base) MCG/ACT inhaler Inhale 1-2 puffs into the lungs every 6 (six) hours as needed for wheezing or shortness of breath.    . cetirizine (ZYRTEC) 10 MG tablet Take 10 mg by mouth at bedtime.     . cholecalciferol (VITAMIN D) 1000 units tablet Take 1,000 Units by mouth 2 (two) times daily.    Marland Kitchen esomeprazole (NEXIUM) 20 MG capsule Take 20 mg by mouth daily at 12 noon.    . fluticasone furoate-vilanterol (BREO ELLIPTA) 100-25 MCG/INH AEPB Inhale 1 puff into the lungs daily.    . hydrOXYzine (ATARAX/VISTARIL) 25 MG tablet Take 25-50 mg by mouth at bedtime.     Marland Kitchen levocetirizine (XYZAL) 5 MG tablet Take 5 mg by mouth every evening.    . triamterene-hydrochlorothiazide (MAXZIDE-25) 37.5-25 MG tablet 1 tablet in the morning    . acetaminophen (TYLENOL) 325 MG tablet Take 2 tablets (650 mg total) by mouth every 4 (four) hours.  No facility-administered medications prior to visit.    PAST MEDICAL HISTORY: Past Medical History:  Diagnosis Date  . Asthma   . Bell's palsy 06/2019  . Environmental and seasonal allergies   . GERD (gastroesophageal reflux disease)   . Left-sided Bell's palsy 10/07/2019   Onset 06/08/19  . Morbid obesity (HCC) 10/07/2019  . Vitamin D deficiency     PAST SURGICAL HISTORY: Past Surgical History:  Procedure Laterality Date  . APPENDECTOMY    . BACK SURGERY    . CHOLECYSTECTOMY N/A 04/01/2017   Procedure: LAPAROSCOPIC CHOLECYSTECTOMY WITH INTRAOPERATIVE CHOLANGIOGRAM;  Surgeon: Gaynelle Adu,  MD;  Location: [facility];  Service: General;  Laterality: N/A;  . LUMBAR DISC SURGERY  2009  . TUBAL LIGATION      FAMILY HISTORY: Family History  Problem Relation Age of Onset  . Hypercholesterolemia Mother   . Skin cancer Mother   . Colon cancer Father   . Colon cancer Maternal Grandmother   . Stroke Maternal Grandmother   . Diabetes Paternal Grandmother   . Prostate cancer Paternal Grandfather   . Lung cancer Paternal Grandfather     SOCIAL HISTORY: Social History   Socioeconomic History  . Marital status: Divorced    Spouse name: Not on file  . Number of children: 3  . Years of education: Not on file  . Highest education level: Not on file  Occupational History  . Not on file  Tobacco Use  . Smoking status: Never Smoker  . Smokeless tobacco: Never Used  Substance and Sexual Activity  . Alcohol use: No  . Drug use: No  . Sexual activity: Not on file  Other Topics Concern  . Not on file  Social History Narrative   Soda daily  (1 per day)   Right handed    Social Determinants of Health   Financial Resource Strain: Not on file  Food Insecurity: Not on file  Transportation Needs: Not on file  Physical Activity: Not on file  Stress: Not on file  Social Connections: Not on file  Intimate Partner Violence: Not on file   PHYSICAL EXAM  Vitals:   01/16/20 1529  BP: (!) 151/93  Pulse: 94  Weight: (!) 330 lb (149.7 kg)  Height: 5\' 10"  (1.778 m)   Body mass index is 47.35 kg/m.  Generalized: Well developed, in no acute distress   Neurological examination  Mentation: Alert oriented to time, place, history taking. Follows all commands speech and language fluent Cranial nerve II-XII: Pupils were equal round reactive to light. Extraocular movements were full, visual field were full on confrontational test.  Facial asymmetry noted to the left face, left eye brow is elevated, left eyelid does not completely close with blinking. Mild left eye open weakness.  Normal  sensation to soft touch of the face. With smiling, left mouth asymmetry.  Left cheek puff weakness. Noted aberrant regeneration, smiling droop on the left, closing of right eye. Motor: The motor testing reveals 5 over 5 strength of all 4 extremities. Good symmetric motor tone is noted throughout.  Sensory: Sensory testing is intact to soft touch on all 4 extremities. No evidence of extinction is noted.  Coordination: Cerebellar testing reveals good finger-nose-finger and heel-to-shin bilaterally.  Gait and station: Gait is normal. Tandem gait is normal.  Reflexes: Deep tendon reflexes are symmetric and normal bilaterally.   DIAGNOSTIC DATA (LABS, IMAGING, TESTING) - I reviewed patient records, labs, notes, testing and imaging myself where available.  Lab Results  Component Value  Date   WBC 13.2 (H) 04/01/2017   HGB 11.0 (L) 04/01/2017   HCT 35.5 (L) 04/01/2017   MCV 83.9 04/01/2017   PLT 356 04/01/2017      Component Value Date/Time   NA 135 04/01/2017 0140   K 4.0 04/01/2017 0140   CL 100 (L) 04/01/2017 0140   CO2 26 04/01/2017 0140   GLUCOSE 127 (H) 04/01/2017 0140   BUN 11 04/01/2017 0140   CREATININE 0.67 04/01/2017 0140   CALCIUM 8.9 04/01/2017 0140   PROT 7.3 04/01/2017 0140   ALBUMIN 3.7 04/01/2017 0140   AST 21 04/01/2017 0140   ALT 10 (L) 04/01/2017 0140   ALKPHOS 56 04/01/2017 0140   BILITOT 0.4 04/01/2017 0140   GFRNONAA >60 04/01/2017 0140   GFRAA >60 04/01/2017 0140   No results found for: CHOL, HDL, LDLCALC, LDLDIRECT, TRIG, CHOLHDL Lab Results  Component Value Date   HGBA1C 6.4 (H) 10/07/2019   Lab Results  Component Value Date   VITAMINB12 433 10/07/2019   No results found for: TSH  ASSESSMENT AND PLAN 50 y.o. year old female  has a past medical history of Asthma, Bell's palsy (06/2019), Environmental and seasonal allergies, GERD (gastroesophageal reflux disease), Left-sided Bell's palsy (10/07/2019), Morbid obesity (HCC) (10/07/2019), and Vitamin D  deficiency. here with:  1.  Severe left Bell's palsy 2.  Morbid obesity  She continues to fail to show significant improvement in her Bell's palsy since the initial event in June 2021.  Given the lack of improvement, I will order MRI of the brain with and without contrast.  Recheck sedimentation rate, was mildly elevated.  We will check creatinine today since using contrast.  Reviewed elevated A1c 6.4, can be risk factor for Bell's palsy.  Once labs result tomorrow, will forward to PCP, needs management of A1C. There are clinical signs of aberrant regeneration on exam.  She will follow-up in 4 months or sooner if needed.  I spent 30 minutes of face-to-face and non-face-to-face time with patient.  This included previsit chart review, lab review, study review, order entry, electronic health record documentation, patient education.  Margie Ege, AGNP-C, DNP 01/16/2020, 3:55 PM [facility] [address], [address] [place], [place] [address] [phone]

## 2020-01-16 NOTE — Progress Notes (Signed)
I have read the note, and I agree with the clinical assessment and plan.  Sulayman Manning K Haralambos Yeatts   

## 2020-01-16 NOTE — Patient Instructions (Signed)
Check MRI of the brain Check labs today See you back in 4 months

## 2020-01-17 LAB — CREATININE, SERUM
Creatinine, Ser: 0.86 mg/dL (ref 0.57–1.00)
GFR calc Af Amer: 93 mL/min/{1.73_m2} (ref >59)
GFR calc non Af Amer: 81 mL/min/{1.73_m2} (ref >59)

## 2020-01-17 LAB — SEDIMENTATION RATE: Sed Rate: 60 mm/hr — ABNORMAL HIGH (ref 0–32)

## 2020-01-18 DIAGNOSIS — J069 Acute upper respiratory infection, unspecified: Secondary | ICD-10-CM | POA: Diagnosis not present

## 2020-01-18 DIAGNOSIS — H6691 Otitis media, unspecified, right ear: Secondary | ICD-10-CM | POA: Diagnosis not present

## 2020-01-18 DIAGNOSIS — Z1159 Encounter for screening for other viral diseases: Secondary | ICD-10-CM | POA: Diagnosis not present

## 2020-01-19 ENCOUNTER — Telehealth: Payer: Self-pay | Admitting: Neurology

## 2020-01-19 NOTE — Telephone Encounter (Signed)
[employer] Berkley Harvey: [id number] (exp. 01/19/20 to 07/16/20) order sent to GI. They will reach out to the patient to schedule.

## 2020-01-24 ENCOUNTER — Telehealth: Payer: Self-pay

## 2020-01-24 NOTE — Telephone Encounter (Signed)
Contacted Pt regarding lab results from Maralyn Sago, she was informed that Labs show continued elevated sed rate, non specific marker of inflammation. Unclear etiology with previous lab work up, will follow overtime. Pending MRI results. Advised her to call with questions, she understood.

## 2020-02-02 ENCOUNTER — Ambulatory Visit: Payer: BC Managed Care – PPO | Admitting: Family Medicine

## 2020-02-10 ENCOUNTER — Ambulatory Visit
Admission: RE | Admit: 2020-02-10 | Discharge: 2020-02-10 | Disposition: A | Discharge status: Home / Self Care | Payer: BC Managed Care – PPO | Source: Ambulatory Visit | Attending: Neurology | Admitting: Neurology

## 2020-02-10 ENCOUNTER — Other Ambulatory Visit: Payer: Self-pay

## 2020-02-10 DIAGNOSIS — I10 Essential (primary) hypertension: Secondary | ICD-10-CM | POA: Diagnosis not present

## 2020-02-10 DIAGNOSIS — G510 Bell's palsy: Secondary | ICD-10-CM | POA: Diagnosis not present

## 2020-02-10 DIAGNOSIS — R7303 Prediabetes: Secondary | ICD-10-CM | POA: Diagnosis not present

## 2020-02-10 MED ORDER — GADOBENATE DIMEGLUMINE 529 MG/ML IV SOLN
20.0000 mL | Freq: Once | INTRAVENOUS | Status: AC | PRN
  Administered 2020-02-10: 20 mL via INTRAVENOUS

## 2020-02-15 ENCOUNTER — Telehealth: Payer: Self-pay

## 2020-02-15 NOTE — Telephone Encounter (Signed)
Pt verified by name and DOB,  normal results given per provider, pt voiced understanding all question answered. °

## 2020-02-15 NOTE — Telephone Encounter (Signed)
-----   Message from Glean Salvo, NP sent at 02/14/2020  9:54 AM EST ----- Please let the patient know, MRI of the brain with and without contrast was normal. I will update her next week if any further steps are needed for her work-up.   IMPRESSION: This is a normal MRI of the brain with and without contrast. Additional thin section sequences through the internal auditory canals with normal.

## 2020-03-01 DIAGNOSIS — R1011 Right upper quadrant pain: Secondary | ICD-10-CM | POA: Diagnosis not present

## 2020-03-01 DIAGNOSIS — H00012 Hordeolum externum right lower eyelid: Secondary | ICD-10-CM | POA: Diagnosis not present

## 2020-03-01 DIAGNOSIS — R109 Unspecified abdominal pain: Secondary | ICD-10-CM | POA: Diagnosis not present

## 2020-03-15 ENCOUNTER — Ambulatory Visit (INDEPENDENT_AMBULATORY_CARE_PROVIDER_SITE_OTHER): Payer: BC Managed Care – PPO | Admitting: Family Medicine

## 2020-03-15 ENCOUNTER — Other Ambulatory Visit: Payer: Self-pay

## 2020-03-15 DIAGNOSIS — I10 Essential (primary) hypertension: Secondary | ICD-10-CM | POA: Diagnosis not present

## 2020-03-15 DIAGNOSIS — R7303 Prediabetes: Secondary | ICD-10-CM

## 2020-03-15 NOTE — Progress Notes (Signed)
Telehealth Encounter (Text link to [phone] ) I connected with Karen Mendoza (MRN [id number]) on 03/15/2020 by MyChart video-enabled, HIPAA-compliant telemedicine application, verified that I was speaking with the correct person using two identifiers, and that the patient was in a private environment conducive to confidentiality.  The patient agreed to proceed.    Persons participating in visit were patient and provider (registered dietitian) Linna Darner, PhD, RD, LDN, CEDRD.  Provider was located at [facility] during this telehealth encounter; patient was at home.  Appt start time: 1600 end time: 1700 (1 hour)  Reason for telehealth visit: Referred by Laurann Montana, MD for Medical Nutrition Therapy related to pre-diabetes (R73.03) and class III obesity (E66.01; BMI 40-49.9).  Also has HTN.    Relevant history/background: [name] wants to learn what to eat (which she likes).  Her main interests are to help lower BG and BP.   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  She works ~6 AM to 4:30 PM M thru Thursday totaling 40 hrs a week, but hours vary depending on how long she works the first days of the week, which makes predictability of time for exercise difficult.   Usual eating pattern: 2-3 meals and 0-1 snack per day. Frequent foods and beverages: water, 12-24 oz soda/day, 24 oz juice; pancake on a stick, chx/meat at lunch and dinner, veg, starch at lunch and dinner.    Avoided foods: eggs, mushrooms, ham, red dye (allergy), most fish, seafood, most beans.   Weight: 330.4 lb (BMI 49.14) on 02/10/20.  Ht 68.75".  Usual physical activity: none currently.  Has tried to walk a mile a day, but has not been consistent.  Sleep: Estimates average of 7-8 hours of sleep/night.  24-hr recall:  (Up at 4 AM) B (5:30 AM)-  water Snk (9 AM)-  Pancake on a stick, grapefruit cup L (12 PM)-  5-6 oz fried chx  brst, pineapple cup, diet Pepsi Snk ( PM)-  water D (6:30 PM)-  12" Steak&chs sub (let, tom, pkls, pep's, honey mustard), 32 oz lemonade Snk ( PM)-  --- Typical day? Yes.     Intervention: Completed diet and exercise history, and established behavioral goals.   For recommendations and goals, see Patient Instructions.    Follow-up: 10 weeks.    SYKES,JEANNIE

## 2020-03-15 NOTE — Patient Instructions (Addendum)
Important dietary changes to help lower blood pressure (BP):  - Limit sodium to no more than 1500 mg/day.  Read labels, and track sodium intake for the next two weeks.  Think twice about any food that has more than 200 mg of sodium per serving.   - Aim for twice the volume of veg's as either protein or starch foods at both lunch and dinner.  All fruits and veg's have good to excellent levels of potassium.    Especially important for managing blood glucose (BG):  - Limit starchy foods to TWO per meal and ONE per snack. ONE portion of a starchy food is equal to the following:   - ONE slice of bread (or its equivalent, such as half of a hamburger bun).   - 1/2 cup of a "scoopable" starchy food such as potatoes or rice.   - 15 grams of Total Carbohydrate as shown on food label.   - Every 4 ounces of a sweet drink (including fruit juice).  Carbohydrate includes starch, sugar, and fiber.  Of these, only sugar and starch raise blood glucose.  (Fiber is found in fruits, vegetables [especially skin, seeds, and stalks], whole grains, and beans.)   Starchy (carb) foods: Bread, rice, pasta, potatoes, corn, cereal, grits, crackers, bagels, muffins, all baked goods.  (Fruit, milk, and yogurt also have carbohydrate, but most of these foods will not spike your blood sugar as most starchy or sweet foods will.)  A few fruits do cause high blood sugars; use small portions of bananas (limit to 1/2 at a time), grapes, watermelon, and oranges.   Protein foods: Meat, fish, poultry, eggs, dairy foods, and beans such as pinto and kidney beans (beans also provide carbohydrate).   In addition, physical activity is important for both BP and BG control.  Especially helpful to BG is to walk after your biggest meal of the day.   GOAL: Walk at least 1 mile 3 times a week.    - Plan a consistent schedule of walking: Fri, Sat, Sun.    - Schedule your walk on your phone with an alert to remind you.    - Document your exercise,  writing the number of minutes you walk as well as total steps for the day.   Summary of your behavioral goals:  1. Limit sodium to no more than 1500 mg/day.  2. Aim for twice the volume of veg's as either protein or starch foods at both lunch and dinner.  3. Limit starchy foods to TWO per meal and ONE per snack.  4. Walk at least 1 mile 3 times a week.   Follow-up: Video visit on Thursday, June 2 at 4 PM.  Call to change to in-office visit if you find out you don't have to work that day.

## 2020-05-11 DIAGNOSIS — K625 Hemorrhage of anus and rectum: Secondary | ICD-10-CM | POA: Diagnosis not present

## 2020-05-11 DIAGNOSIS — M940 Chondrocostal junction syndrome [Tietze]: Secondary | ICD-10-CM | POA: Diagnosis not present

## 2020-05-11 DIAGNOSIS — I10 Essential (primary) hypertension: Secondary | ICD-10-CM | POA: Diagnosis not present

## 2020-05-11 DIAGNOSIS — Z131 Encounter for screening for diabetes mellitus: Secondary | ICD-10-CM | POA: Diagnosis not present

## 2020-05-11 DIAGNOSIS — M549 Dorsalgia, unspecified: Secondary | ICD-10-CM | POA: Diagnosis not present

## 2020-05-17 ENCOUNTER — Ambulatory Visit (INDEPENDENT_AMBULATORY_CARE_PROVIDER_SITE_OTHER): Payer: BC Managed Care – PPO | Admitting: Neurology

## 2020-05-17 ENCOUNTER — Encounter: Payer: Self-pay | Admitting: Neurology

## 2020-05-17 VITALS — BP 147/82 | HR 78 | Ht 70.0 in | Wt 332.0 lb

## 2020-05-17 DIAGNOSIS — G510 Bell's palsy: Secondary | ICD-10-CM | POA: Diagnosis not present

## 2020-05-17 NOTE — Patient Instructions (Signed)
Please work with your primary doctor on control of your glucose  See you back as needed

## 2020-05-17 NOTE — Progress Notes (Signed)
PATIENT: Karen Mendoza DOB: [birth year]  REASON FOR VISIT: follow up HISTORY FROM: patient  HISTORY OF PRESENT ILLNESS: Today 05/17/20 Karen Mendoza is a 50 year old female with history of left-sided Bell's palsy in June 2021, few weeks after her first COVID vaccination.  Sed rate was 54, A1c 6.4.  MRI of the brain with and without contrast was completed in February 2022 due to lack of much improvement in symptoms.  It was normal.  Recheck of sed rate was 60.  Reportedly has seen her PCP for management of hyperglycemia, medications were too expensive, is not on anything right now.  Reports last A1c was recently 6.3.  She has not had much improvement of the Bell's palsy, continues to have drooping of the left face with smiling, she is able to completely close her left eyelid with blinking.  She is seeing ophthalmology, using eyedrops.  Still has some trouble chewing on the side her food gets caught.  Here today for evaluation unaccompanied.  Update 01/16/2020 SS: Karen Mendoza is a 50 year old female with a history of left-sided Bell's palsy since June 2021.  Laboratory evaluation showed an elevation in sedimentation rate 54, elevated A1c 6.4.  Has yet to have much improvement.  Since initial event, drooping to the left side has improved at baseline, but continues to be notable with smile. Cannot completely close left eyelid with blinking.  Some trouble with chewing on the left side, food gets caught on the left side.  No weakness to the arms or legs.  No sensation of dryness to the left cornea. Works in the [employer] at a [employer].  Here today for follow-up unaccompanied.  HISTORY  10/07/2019 Dr. Anne Hahn: Karen Mendoza is a 50 year old right-handed white female with history of morbid obesity and asthma who comes to the office today for evaluation of a left Bell's palsy that began around 06/08/2019.  The patient had some twitching around the left eye the day before but woke up with severe  weakness of the left facial nerve the next day.  The patient has not had much improvement at all since onset with exception that she is able to close the left eye a little bit better.  She is not having any sensation of dryness of the left cornea or tearing of the eye.  She reported some mild achiness in front of the ear on the left, but she never had any change in taste sensation or altered hearing, loud noises are not particularly bothersome to her.  The patient denies any numbness or weakness elsewhere in the body, she denies any problems with double vision or loss of vision.  She has not had any problems swallowing.  She has not had any changes in balance or difficulty controlling the bowels or the bladder.  There have been no problems with dizziness or headache.  Due to the persistence of symptoms, the patient was sent to this office for an evaluation.  REVIEW OF SYSTEMS: Out of a complete 14 system review of symptoms, the patient complains only of the following symptoms, and all other reviewed systems are negative.   Bell's palsy  ALLERGIES: Allergies  Allergen Reactions  . Red Dye Hives  . Indomethacin Other (See Comments)    Causes asthma to flare up    HOME MEDICATIONS: Outpatient Medications Prior to Visit  Medication Sig Dispense Refill  . albuterol (PROVENTIL HFA;VENTOLIN HFA) 108 (90 Base) MCG/ACT inhaler Inhale 1-2 puffs into the lungs every 6 (six) hours as  needed for wheezing or shortness of breath.    . cholecalciferol (VITAMIN D) 1000 units tablet Take 1,000 Units by mouth 2 (two) times daily.    Marland Kitchen esomeprazole (NEXIUM) 20 MG capsule Take 20 mg by mouth daily at 12 noon.    . fluticasone furoate-vilanterol (BREO ELLIPTA) 100-25 MCG/INH AEPB Inhale 1 puff into the lungs daily.    . hydrOXYzine (ATARAX/VISTARIL) 25 MG tablet Take 25-50 mg by mouth at bedtime.     Marland Kitchen levocetirizine (XYZAL) 5 MG tablet Take 5 mg by mouth in the morning.    . montelukast (SINGULAIR) 10 MG tablet  Take 1 tablet by mouth daily.    Marland Kitchen triamterene-hydrochlorothiazide (MAXZIDE-25) 37.5-25 MG tablet 1 tablet in the morning    . cetirizine (ZYRTEC) 10 MG tablet Take 10 mg by mouth at bedtime.      No facility-administered medications prior to visit.    PAST MEDICAL HISTORY: Past Medical History:  Diagnosis Date  . Asthma   . Bell's palsy 06/2019  . Environmental and seasonal allergies   . GERD (gastroesophageal reflux disease)   . Left-sided Bell's palsy 10/07/2019   Onset 06/08/19  . Morbid obesity (HCC) 10/07/2019  . Vitamin D deficiency     PAST SURGICAL HISTORY: Past Surgical History:  Procedure Laterality Date  . APPENDECTOMY    . BACK SURGERY    . CHOLECYSTECTOMY N/A 04/01/2017   Procedure: LAPAROSCOPIC CHOLECYSTECTOMY WITH INTRAOPERATIVE CHOLANGIOGRAM;  Surgeon: Gaynelle Adu, MD;  Location: [facility];  Service: General;  Laterality: N/A;  . LUMBAR DISC SURGERY  2009  . TUBAL LIGATION      FAMILY HISTORY: Family History  Problem Relation Age of Onset  . Hypercholesterolemia Mother   . Skin cancer Mother   . Colon cancer Father   . Colon cancer Maternal Grandmother   . Stroke Maternal Grandmother   . Diabetes Paternal Grandmother   . Prostate cancer Paternal Grandfather   . Lung cancer Paternal Grandfather     SOCIAL HISTORY: Social History   Socioeconomic History  . Marital status: Divorced    Spouse name: Not on file  . Number of children: 3  . Years of education: Not on file  . Highest education level: Not on file  Occupational History  . Not on file  Tobacco Use  . Smoking status: Never Smoker  . Smokeless tobacco: Never Used  Substance and Sexual Activity  . Alcohol use: No  . Drug use: No  . Sexual activity: Not on file  Other Topics Concern  . Not on file  Social History Narrative   Soda daily  (1 per day)   Right handed    Social Determinants of Health   Financial Resource Strain: Not on file  Food Insecurity: Not on file  Transportation  Needs: Not on file  Physical Activity: Not on file  Stress: Not on file  Social Connections: Not on file  Intimate Partner Violence: Not on file   PHYSICAL EXAM  Vitals:   05/17/20 1553  BP: (!) 147/82  Pulse: 78  Weight: (!) 332 lb (150.6 kg)  Height: 5\' 10"  (1.778 m)   Body mass index is 47.64 kg/m.  Generalized: Well developed, in no acute distress   Neurological examination  Mentation: Alert oriented to time, place, history taking. Follows all commands speech and language fluent Cranial nerve II-XII: Pupils were equal round reactive to light. Extraocular movements were full, visual field were full on confrontational test.  Facial asymmetry noted to the left  face, left eye brow is elevated, left eyelid does now completely close with blinking. Mild left eye open weakness.  Normal sensation to soft touch of the face. With smiling, left mouth asymmetry.  Left cheek puff weakness. Noted aberrant regeneration, smiling droop on the left, closing of right eye. Motor: The motor testing reveals 5 over 5 strength of all 4 extremities. Good symmetric motor tone is noted throughout.  Sensory: Sensory testing is intact to soft touch on all 4 extremities. No evidence of extinction is noted.  Coordination: Cerebellar testing reveals good finger-nose-finger and heel-to-shin bilaterally.  Gait and station: Gait is normal. Tandem gait is normal.  Reflexes: Deep tendon reflexes are symmetric and normal bilaterally.   DIAGNOSTIC DATA (LABS, IMAGING, TESTING) - I reviewed patient records, labs, notes, testing and imaging myself where available.  Lab Results  Component Value Date   WBC 13.2 (H) 04/01/2017   HGB 11.0 (L) 04/01/2017   HCT 35.5 (L) 04/01/2017   MCV 83.9 04/01/2017   PLT 356 04/01/2017      Component Value Date/Time   NA 135 04/01/2017 0140   K 4.0 04/01/2017 0140   CL 100 (L) 04/01/2017 0140   CO2 26 04/01/2017 0140   GLUCOSE 127 (H) 04/01/2017 0140   BUN 11 04/01/2017 0140    CREATININE 0.86 01/16/2020 1605   CALCIUM 8.9 04/01/2017 0140   PROT 7.3 04/01/2017 0140   ALBUMIN 3.7 04/01/2017 0140   AST 21 04/01/2017 0140   ALT 10 (L) 04/01/2017 0140   ALKPHOS 56 04/01/2017 0140   BILITOT 0.4 04/01/2017 0140   GFRNONAA 81 01/16/2020 1605   GFRAA 93 01/16/2020 1605   No results found for: CHOL, HDL, LDLCALC, LDLDIRECT, TRIG, CHOLHDL Lab Results  Component Value Date   HGBA1C 6.4 (H) 10/07/2019   Lab Results  Component Value Date   VITAMINB12 433 10/07/2019   No results found for: TSH  ASSESSMENT AND PLAN 50 y.o. year old female  has a past medical history of Asthma, Bell's palsy (06/2019), Environmental and seasonal allergies, GERD (gastroesophageal reflux disease), Left-sided Bell's palsy (10/07/2019), Morbid obesity (HCC) (10/07/2019), and Vitamin D deficiency. here with:  1.  Severe left Bell's palsy 2.  Morbid obesity  -She has yet to make a complete recovery since the initial event in June 2021, can now close left eyelid with blinking, seeing opthalmology  -MRI of the brain was normal -Discussed elevated A1C can be a risk factor for Bell's Palsy, encouraged management with primary care, was recently 6.3 -There are clinical signs of aberrant regeneration on exam -Follow-up on as needed basis  Otila Kluver, DNP 05/17/2020, 4:48 PM [facility] [address], [address] [place], [place] [address] [phone]

## 2020-05-17 NOTE — Progress Notes (Signed)
I have read the note, and I agree with the clinical assessment and plan.  Kniyah Khun K Darenda Fike   

## 2020-06-07 ENCOUNTER — Ambulatory Visit: Payer: BC Managed Care – PPO | Admitting: Family Medicine

## 2020-06-07 ENCOUNTER — Other Ambulatory Visit: Payer: Self-pay

## 2020-11-01 ENCOUNTER — Other Ambulatory Visit: Payer: Self-pay | Admitting: Family Medicine

## 2020-11-01 DIAGNOSIS — Z1231 Encounter for screening mammogram for malignant neoplasm of breast: Secondary | ICD-10-CM

## 2020-11-23 IMAGING — MG MM DIGITAL SCREENING BILAT W/ TOMO W/ CAD
6 of 10 series · 6 of 30 positions shown · non-contrast
Comparison: Previous exam(s).

CLINICAL DATA: Screening.

EXAM:
DIGITAL SCREENING BILATERAL MAMMOGRAM WITH TOMO AND CAD

[R MLO synth-2D]
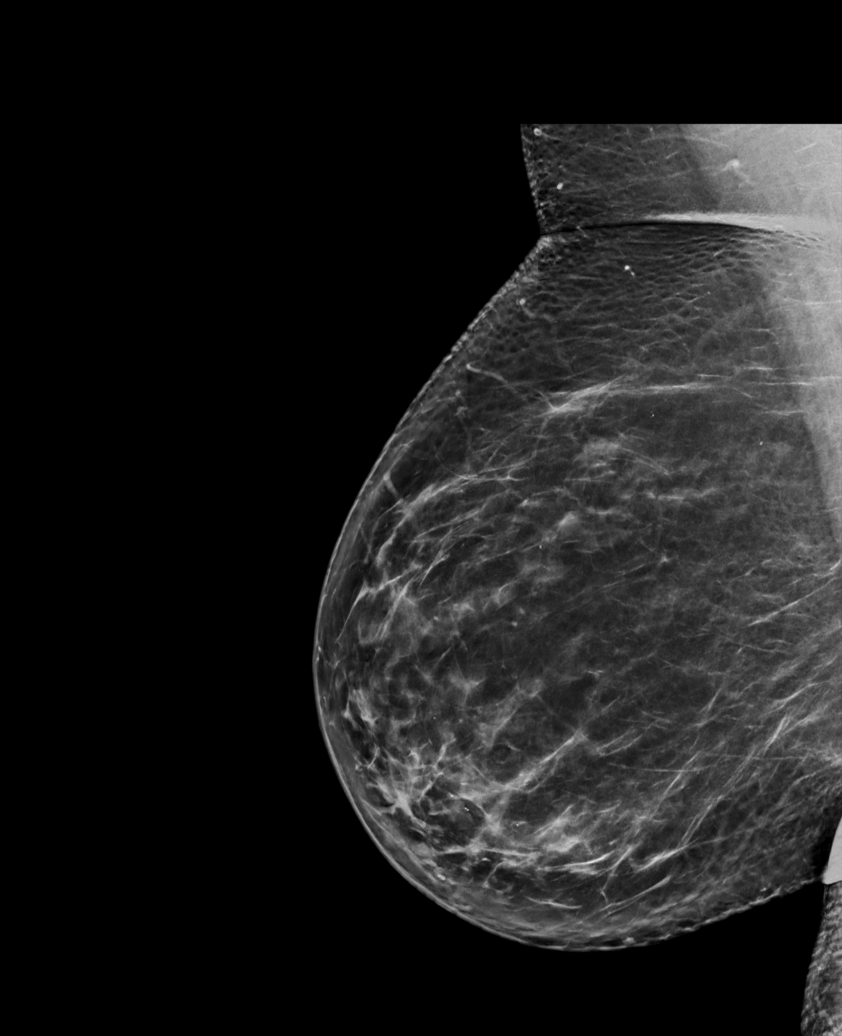

[L MLO synth-2D (1 of 2)]
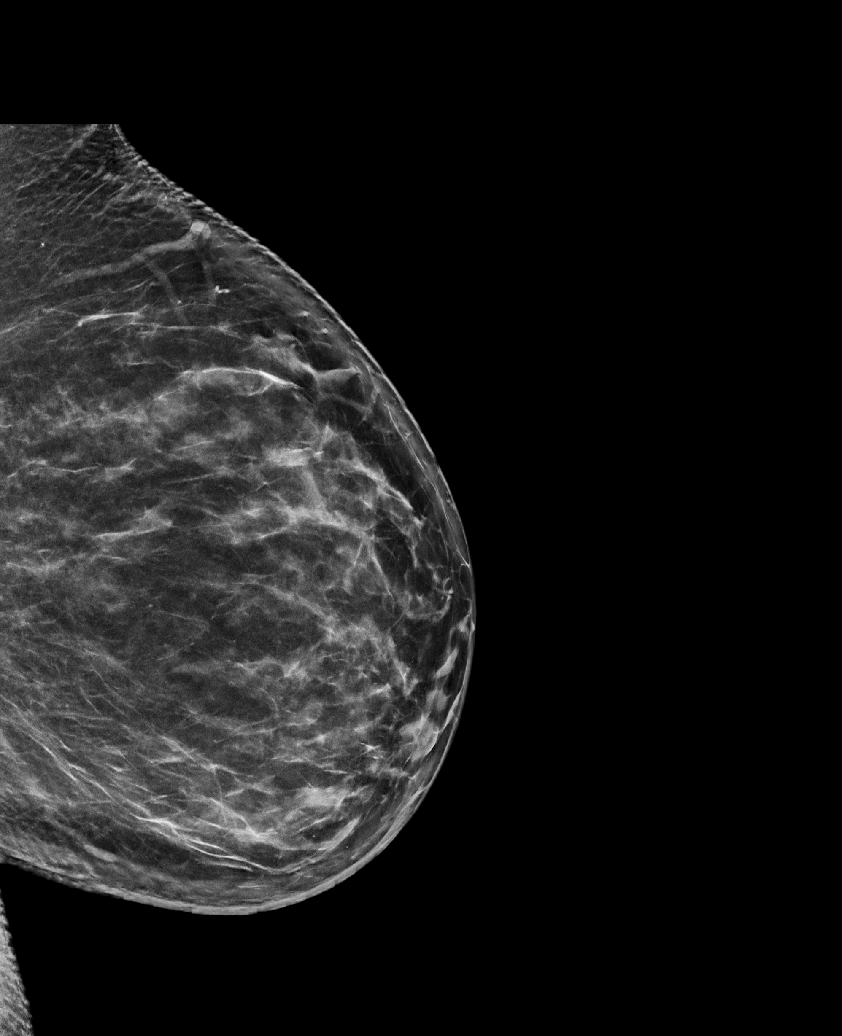

[L MLO synth-2D (2 of 2)]
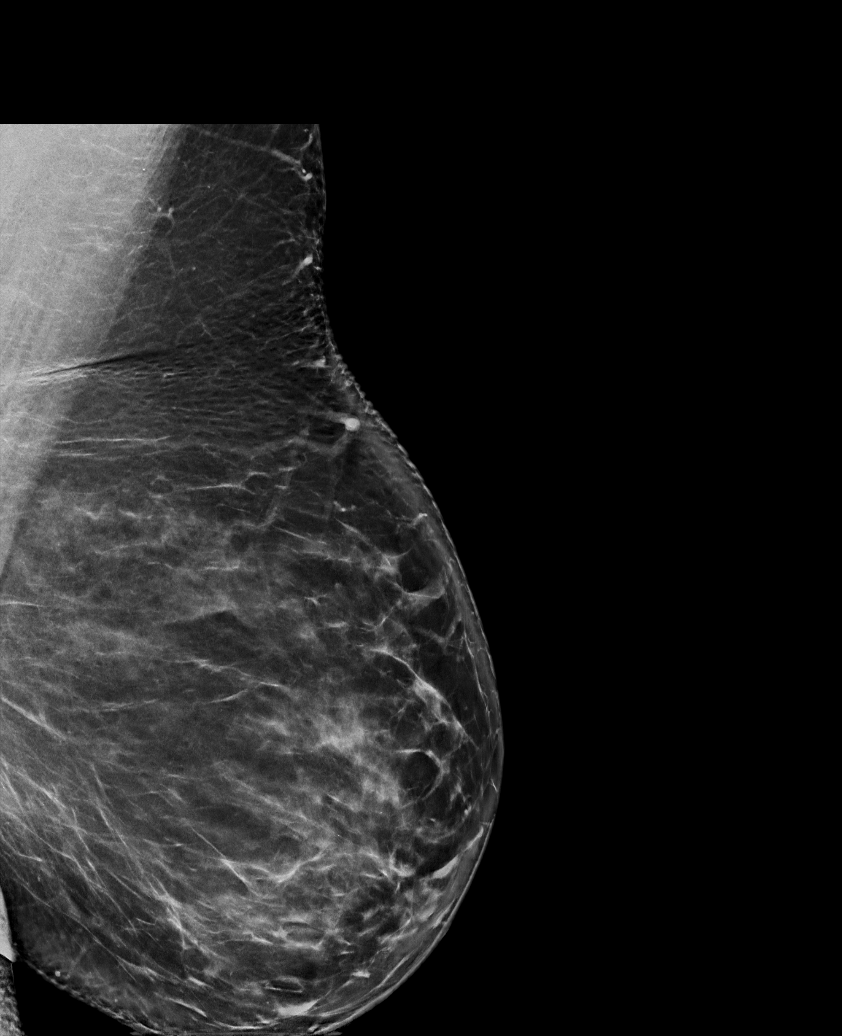

[L CC synth-2D]
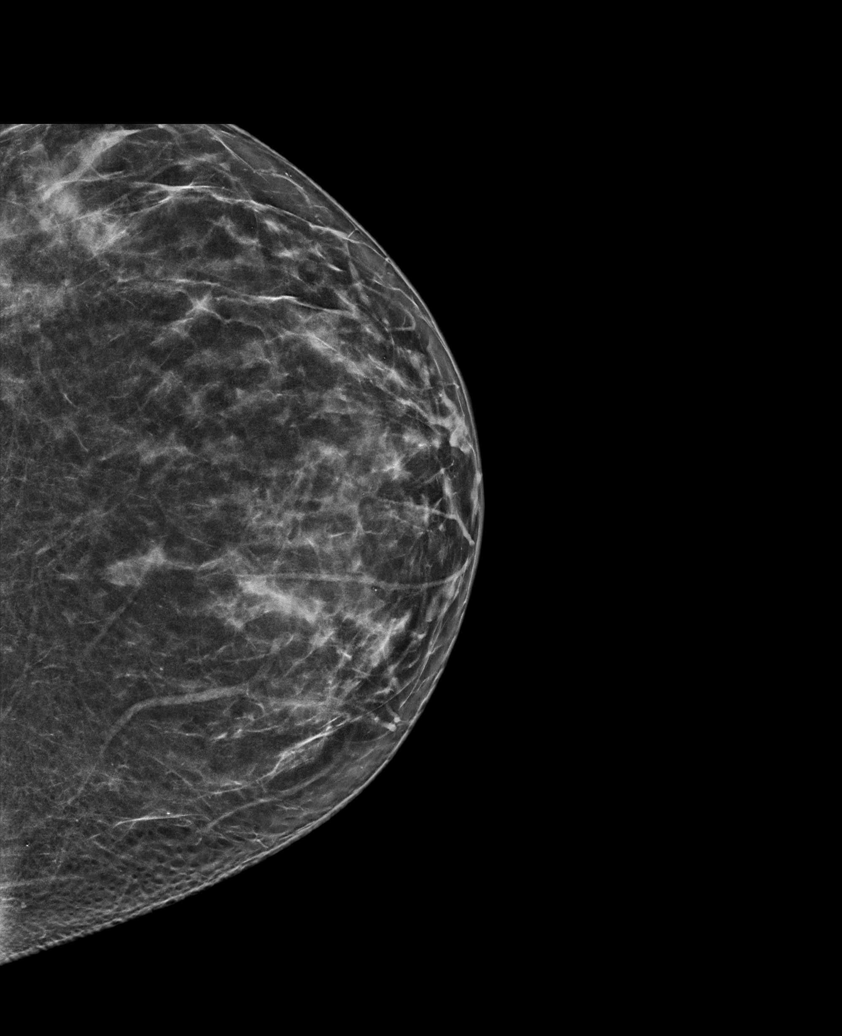

[R CC synth-2D]
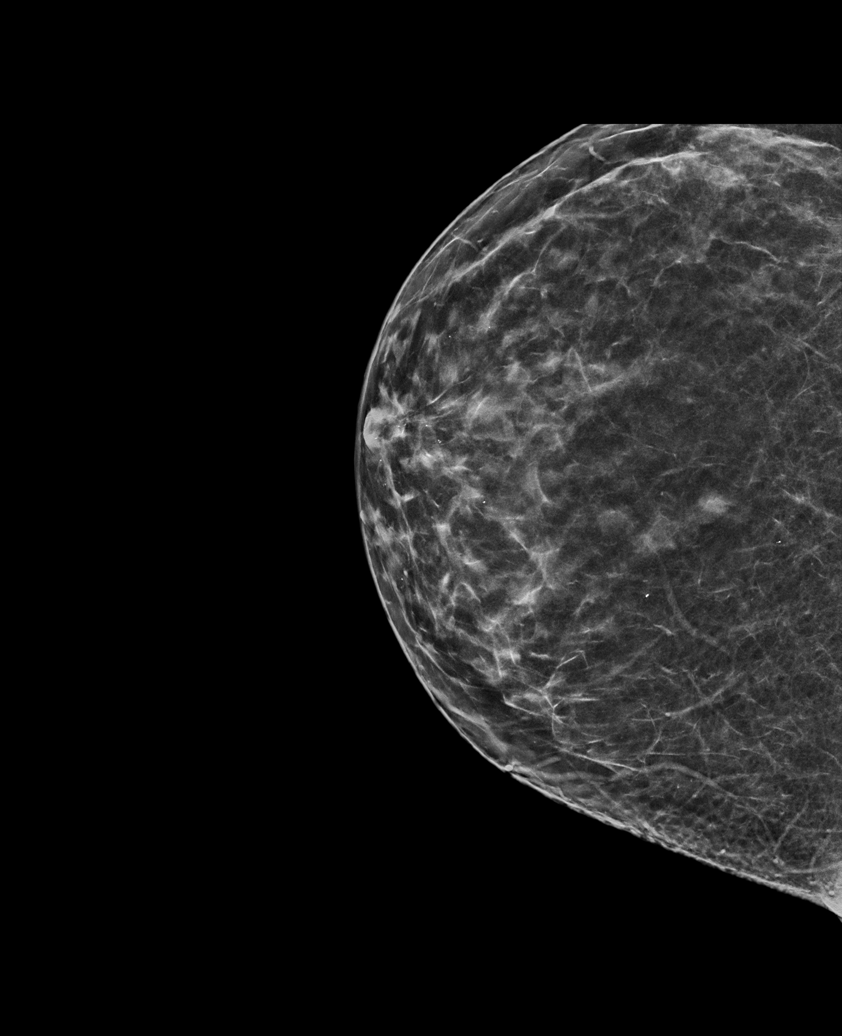

[L CC tomo · tomo slice 37/73.0]
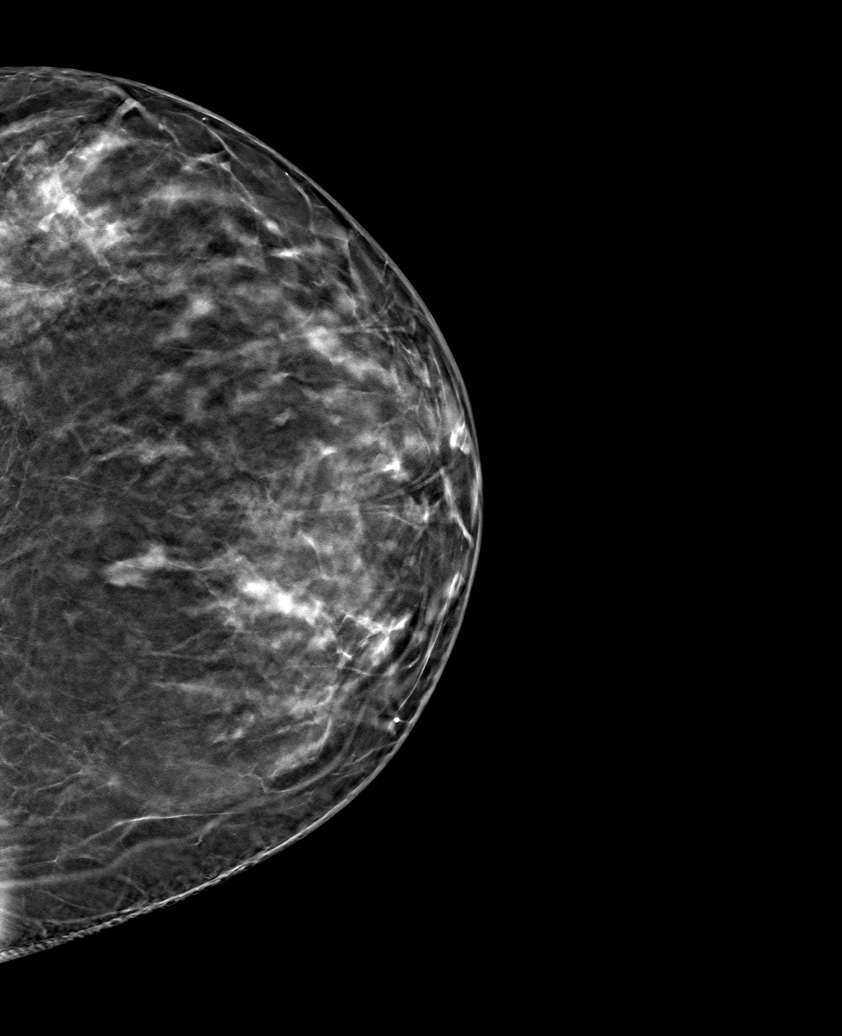

[6 of 30 positions shown; findings below may reference images not displayed]

ACR Breast Density Category c: The breast tissue is heterogeneously
dense, which may obscure small masses.
FINDINGS: There are no findings suspicious for malignancy. Images were
processed with CAD.
IMPRESSION: No mammographic evidence of malignancy. A result letter of this
screening mammogram will be mailed directly to the patient.

RECOMMENDATION:
Screening mammogram in one year. (Code:[id number]-U-LHB)

BI-RADS CATEGORY  1: Negative.

## 2020-12-07 ENCOUNTER — Ambulatory Visit
Admission: RE | Admit: 2020-12-07 | Discharge: 2020-12-07 | Disposition: A | Discharge status: Home / Self Care | Payer: Managed Care, Other (non HMO) | Source: Ambulatory Visit | Attending: Family Medicine | Admitting: Family Medicine

## 2020-12-07 DIAGNOSIS — Z1231 Encounter for screening mammogram for malignant neoplasm of breast: Secondary | ICD-10-CM

## 2021-03-15 ENCOUNTER — Ambulatory Visit (INDEPENDENT_AMBULATORY_CARE_PROVIDER_SITE_OTHER): Payer: Managed Care, Other (non HMO) | Admitting: Podiatry

## 2021-03-15 ENCOUNTER — Encounter: Payer: Self-pay | Admitting: Podiatry

## 2021-03-15 ENCOUNTER — Ambulatory Visit (INDEPENDENT_AMBULATORY_CARE_PROVIDER_SITE_OTHER): Payer: Managed Care, Other (non HMO)

## 2021-03-15 ENCOUNTER — Other Ambulatory Visit: Payer: Self-pay

## 2021-03-15 DIAGNOSIS — L723 Sebaceous cyst: Secondary | ICD-10-CM

## 2021-03-15 DIAGNOSIS — M722 Plantar fascial fibromatosis: Secondary | ICD-10-CM

## 2021-03-17 NOTE — Progress Notes (Signed)
Subjective:  ? ?Patient ID: Karen Mendoza, female   DOB: 51 y.o.   MRN: [id number]  ? ?HPI ?Patient presents with a mass on the left foot plantarly that she is concerned about and has recognized over the last 6 months to 1 year.  It is not painful and she does not think its been growing as far as she can tell and does not smoke likes to be active ? ? ?Review of Systems  ?All other systems reviewed and are negative. ? ? ?   ?Objective:  ?Physical Exam ?Vitals and nursing note reviewed.  ?Constitutional:   ?   Appearance: She is well-developed.  ?Pulmonary:  ?   Effort: Pulmonary effort is normal.  ?Musculoskeletal:     ?   General: Normal range of motion.  ?Skin: ?   General: Skin is warm.  ?Neurological:  ?   Mental Status: She is alert.  ?  ?Neurovascular status intact muscle strength was found to be adequate range of motion adequate.  Patient is noted to have a mass on the plantar aspect of the left arch within the plantar fascia and the distal portion with what appears to be 2 nodules each measuring about 8 x 8 mm that are within the plantar fascia themselves.  Good digital perfusion well oriented x3 ? ?   ?Assessment:  ?Appears to be nodules related to plantar fibromatosis plantar left arch ? ?   ?Plan:  ?H&P reviewed condition do not recommend current excision reviewed x-rays and I want to put these on a watch and if they were to grow in size change color become painful they can be excised in future but the vast odds of this condition are benign plantar fibroma ? ?X-rays were negative for signs of calcification or other pathology ?   ? ? ?

## 2022-01-22 ENCOUNTER — Other Ambulatory Visit: Payer: Self-pay | Admitting: Family Medicine

## 2022-01-22 DIAGNOSIS — Z1231 Encounter for screening mammogram for malignant neoplasm of breast: Secondary | ICD-10-CM

## 2022-03-14 ENCOUNTER — Ambulatory Visit
Admission: RE | Admit: 2022-03-14 | Discharge: 2022-03-14 | Disposition: A | Discharge status: Home / Self Care | Payer: Managed Care, Other (non HMO) | Source: Ambulatory Visit | Attending: Family Medicine | Admitting: Family Medicine

## 2022-03-14 DIAGNOSIS — Z1231 Encounter for screening mammogram for malignant neoplasm of breast: Secondary | ICD-10-CM

## 2022-03-18 ENCOUNTER — Other Ambulatory Visit: Payer: Self-pay | Admitting: Family Medicine

## 2022-03-18 DIAGNOSIS — R928 Other abnormal and inconclusive findings on diagnostic imaging of breast: Secondary | ICD-10-CM

## 2022-04-11 ENCOUNTER — Ambulatory Visit
Admission: RE | Admit: 2022-04-11 | Discharge: 2022-04-11 | Disposition: A | Discharge status: Home / Self Care | Payer: Managed Care, Other (non HMO) | Source: Ambulatory Visit | Attending: Family Medicine | Admitting: Family Medicine

## 2022-04-11 DIAGNOSIS — R928 Other abnormal and inconclusive findings on diagnostic imaging of breast: Secondary | ICD-10-CM

## 2022-04-14 ENCOUNTER — Other Ambulatory Visit: Payer: Self-pay | Admitting: Family Medicine

## 2022-04-14 DIAGNOSIS — R921 Mammographic calcification found on diagnostic imaging of breast: Secondary | ICD-10-CM

## 2022-04-16 ENCOUNTER — Ambulatory Visit
Admission: RE | Admit: 2022-04-16 | Discharge: 2022-04-16 | Disposition: A | Discharge status: Home / Self Care | Payer: Managed Care, Other (non HMO) | Source: Ambulatory Visit | Attending: Family Medicine | Admitting: Family Medicine

## 2022-04-16 DIAGNOSIS — R921 Mammographic calcification found on diagnostic imaging of breast: Secondary | ICD-10-CM

## 2022-04-16 HISTORY — PX: BREAST BIOPSY: SHX20

## 2022-05-09 ENCOUNTER — Other Ambulatory Visit: Payer: Self-pay | Admitting: Surgery

## 2022-05-09 DIAGNOSIS — N6092 Unspecified benign mammary dysplasia of left breast: Secondary | ICD-10-CM

## 2022-05-13 ENCOUNTER — Other Ambulatory Visit: Payer: Self-pay | Admitting: Surgery

## 2022-05-13 DIAGNOSIS — N6092 Unspecified benign mammary dysplasia of left breast: Secondary | ICD-10-CM

## 2022-06-25 ENCOUNTER — Encounter (HOSPITAL_BASED_OUTPATIENT_CLINIC_OR_DEPARTMENT_OTHER): Payer: Self-pay | Admitting: Surgery

## 2022-06-25 ENCOUNTER — Other Ambulatory Visit: Payer: Self-pay

## 2022-06-25 NOTE — Progress Notes (Signed)
   06/25/22 1240  PAT Phone Screen  Is the patient taking a GLP-1 receptor agonist? Yes  Has the patient been informed on holding medication? Yes  Do You Have Diabetes? Yes  Do You Have Hypertension? Yes  Have You Ever Been to the ER for Asthma? No  Have You Taken Oral Steroids in the Past 3 Months? No  Do you Take Phenteramine or any Other Diet Drugs? No  Recent  Lab Work, EKG, CXR? No  Do you have a history of heart problems? No  Any Recent Hospitalizations? No  Height 5\' 10"  (1.778 m)  Weight (!) 150.6 kg  Pat Appointment Scheduled Yes

## 2022-07-02 ENCOUNTER — Ambulatory Visit
Admission: RE | Admit: 2022-07-02 | Discharge: 2022-07-02 | Disposition: A | Discharge status: Home / Self Care | Payer: Managed Care, Other (non HMO) | Source: Ambulatory Visit | Attending: Surgery | Admitting: Surgery

## 2022-07-02 ENCOUNTER — Encounter (HOSPITAL_BASED_OUTPATIENT_CLINIC_OR_DEPARTMENT_OTHER)
Admission: RE | Admit: 2022-07-02 | Discharge: 2022-07-02 | Disposition: A | Discharge status: Home / Self Care | Payer: Managed Care, Other (non HMO) | Source: Ambulatory Visit | Attending: Surgery | Admitting: Surgery

## 2022-07-02 DIAGNOSIS — Z6839 Body mass index (BMI) 39.0-39.9, adult: Secondary | ICD-10-CM | POA: Diagnosis not present

## 2022-07-02 DIAGNOSIS — K219 Gastro-esophageal reflux disease without esophagitis: Secondary | ICD-10-CM | POA: Diagnosis not present

## 2022-07-02 DIAGNOSIS — I4510 Unspecified right bundle-branch block: Secondary | ICD-10-CM | POA: Diagnosis not present

## 2022-07-02 DIAGNOSIS — E119 Type 2 diabetes mellitus without complications: Secondary | ICD-10-CM | POA: Diagnosis not present

## 2022-07-02 DIAGNOSIS — Z01818 Encounter for other preprocedural examination: Secondary | ICD-10-CM | POA: Diagnosis present

## 2022-07-02 DIAGNOSIS — I10 Essential (primary) hypertension: Secondary | ICD-10-CM | POA: Diagnosis not present

## 2022-07-02 DIAGNOSIS — N62 Hypertrophy of breast: Secondary | ICD-10-CM | POA: Diagnosis not present

## 2022-07-02 DIAGNOSIS — J45909 Unspecified asthma, uncomplicated: Secondary | ICD-10-CM | POA: Diagnosis not present

## 2022-07-02 DIAGNOSIS — Z833 Family history of diabetes mellitus: Secondary | ICD-10-CM | POA: Diagnosis not present

## 2022-07-02 DIAGNOSIS — N6022 Fibroadenosis of left breast: Secondary | ICD-10-CM | POA: Diagnosis present

## 2022-07-02 DIAGNOSIS — N6092 Unspecified benign mammary dysplasia of left breast: Secondary | ICD-10-CM

## 2022-07-02 HISTORY — PX: BREAST BIOPSY: SHX20

## 2022-07-02 LAB — BASIC METABOLIC PANEL
Anion gap: 11 (ref 5–15)
BUN: 12 mg/dL (ref 6–20)
CO2: 27 mmol/L (ref 22–32)
Calcium: 8.9 mg/dL (ref 8.9–10.3)
Chloride: 101 mmol/L (ref 98–111)
Creatinine, Ser: 0.84 mg/dL (ref 0.44–1.00)
GFR, Estimated: >60 mL/min (ref >60)
Glucose, Bld: 65 mg/dL — ABNORMAL LOW (ref 70–99)
Potassium: 3.7 mmol/L (ref 3.5–5.1)
Sodium: 139 mmol/L (ref 135–145)

## 2022-07-02 MED ORDER — ENSURE PRE-SURGERY PO LIQD: 296.0000 mL | Freq: Once | ORAL | Status: DC

## 2022-07-02 NOTE — Anesthesia Preprocedure Evaluation (Signed)
Anesthesia Evaluation  Patient identified by MRN, date of birth, ID band Patient awake    Reviewed: Allergy & Precautions, NPO status , Patient's Chart, lab work & pertinent test results  History of Anesthesia Complications Negative for: history of anesthetic complications  Airway Mallampati: III  TM Distance: >3 FB Neck ROM: Full   Comment: Previous grade I view with MAC 4 Dental  (+) Dental Advisory Given   Pulmonary neg shortness of breath, asthma (last used inhaler 4 months ago) , neg sleep apnea, neg COPD, neg recent URI   Pulmonary exam normal breath sounds clear to auscultation       Cardiovascular hypertension (triamterene-HCTZ), Pt. on medications (-) angina (-) Past MI, (-) Cardiac Stents and (-) CABG + dysrhythmias (incomplete RBBB)  Rhythm:Regular Rate:Normal     Neuro/Psych neg Seizures  Neuromuscular disease (Bell's palsy)    GI/Hepatic Neg liver ROS,GERD  Medicated,,  Endo/Other  diabetes, Type 2  Morbid obesity  Renal/GU negative Renal ROS     Musculoskeletal   Abdominal  (+) + obese  Peds  Hematology negative hematology ROS (+)   Anesthesia Other Findings Left breast atypical ductal hyperplasia  Last Mounjaro: 06/22/2022  Reproductive/Obstetrics                             Anesthesia Physical Anesthesia Plan  ASA: 3  Anesthesia Plan: General   Post-op Pain Management: Tylenol PO (pre-op)*   Induction: Intravenous  PONV Risk Score and Plan: 3 and Ondansetron, Dexamethasone and Treatment may vary due to age or medical condition  Airway Management Planned:   Additional Equipment:   Intra-op Plan:   Post-operative Plan: Extubation in OR  Informed Consent: I have reviewed the patients History and Physical, chart, labs and discussed the procedure including the risks, benefits and alternatives for the proposed anesthesia with the patient or authorized representative  who has indicated his/her understanding and acceptance.     Dental advisory given  Plan Discussed with: Anesthesiologist and CRNA  Anesthesia Plan Comments: (Risks of general anesthesia discussed including, but not limited to, sore throat, hoarse voice, chipped/damaged teeth, injury to vocal cords, nausea and vomiting, allergic reactions, lung infection, heart attack, stroke, and death. All questions answered. )       Anesthesia Quick Evaluation

## 2022-07-02 NOTE — H&P (Signed)
REFERRING PHYSICIAN: Laurann Montana, MD PROVIDER: Wayne Both, MD MRN: [id number] DOB: 1970/05/10  Subjective  Chief Complaint: New Consultation (Left breast ductal hyperplasia)  History of Present Illness: Karen Mendoza is a 52 y.o. female who is seen as an office consultation for evaluation of New Consultation (Left breast ductal hyperplasia)  This is a pleasant 52 year old female who is referred here for atypical ductal hyperplasia of the left breast. She had undergone a screening mammogram which showed several suspicious areas in both breast. She had 1 area biopsied on the right and 2 on the left. The only area of concern was an area on the left that showed atypical ductal hyperplasia. There was no evidence of malignancy in any area. She reports having had no previous problems with her breast and has never had biopsies in the past. There is no family history of breast cancer. She is otherwise without complaints. She denies nipple discharge.  Review of Systems: A complete review of systems was obtained from the patient. I have reviewed this information and discussed as appropriate with the patient. See HPI as well for other ROS.  ROS  Medical History: Past Medical History: Diagnosis Date Asthma, unspecified asthma severity, unspecified whether complicated, unspecified whether persistent (HHS-HCC) Diabetes mellitus without complication (CMS/HHS-HCC) Hypertension  There is no problem list on file for this patient.  Past Surgical History: Procedure Laterality Date APPENDECTOMY CHOLECYSTECTOMY ENDOSCOPIC LUMBAR DISCECTOMY W/ LASER LAPAROSCOPIC TUBAL LIGATION   Allergies Allergen Reactions Red Dye Hives Indomethacin Other (See Comments) Causes asthma  Current Outpatient Medications on File Prior to Visit Medication Sig Dispense Refill albuterol MDI, PROVENTIL, VENTOLIN, PROAIR, HFA 90 mcg/actuation inhaler Inhale into the lungs esomeprazole (NEXIUM) 20 MG DR  capsule Take by mouth levocetirizine (XYZAL) 5 MG tablet Take 5 mg by mouth once daily montelukast (SINGULAIR) 10 mg tablet Take 1 tablet by mouth once daily triamterene-hydroCHLOROthiazide (MAXZIDE-25) 37.5-25 mg tablet 1 tablet in the morning  No current facility-administered medications on file prior to visit.  Family History Problem Relation Age of Onset Colon cancer Father Hyperlipidemia (Elevated cholesterol) Brother High blood pressure (Hypertension) Brother Diabetes Brother   Social History  Tobacco Use Smoking Status Never Smokeless Tobacco Never   Social History  Socioeconomic History Marital status: Divorced Tobacco Use Smoking status: Never Smokeless tobacco: Never Vaping Use Vaping status: Never Used Substance and Sexual Activity Alcohol use: Not Currently Drug use: Never  Objective:  Vitals:  BP: 118/74 Pulse: 80 Temp: 36.7 C (98.1 F) SpO2: 97% Weight: (!) 126.3 kg (278 lb 6.4 oz) Height: 175.3 cm (5\' 9" )  Body mass index is 41.11 kg/m.  Physical Exam  She appears well on exam  There are no palpable breast masses. The nipple areolar complexes appear normal.  There is no axillary adenopathy on either side  Labs, Imaging and Diagnostic Testing: I have reviewed her mammograms and her pathology results  Assessment and Plan:  Diagnoses and all orders for this visit:  Atypical ductal hyperplasia of left breast   I gave the patient a copy of the pathology results and we discussed them in detail. It is recommended that she undergo a left breast lumpectomy to remove the area of the atypical ductal hyperplasia. I discussed the reasons for this with her to rule out an early malignancy. I explained proceeding with a left breast radioactive seed guided lumpectomy. I explained the surgical procedure in detail. We discussed the risks which includes but is not limited to bleeding, infection, injury to surrounding structures, the  need for further  surgery if malignancy is found, cardiopulmonary issues with anesthesia, etc. She understands and wishes to proceed with surgery which will be scheduled

## 2022-07-02 NOTE — Progress Notes (Signed)
Text sent to patient to come for lab work, soap, and drink today

## 2022-07-02 NOTE — Progress Notes (Signed)

## 2022-07-03 ENCOUNTER — Ambulatory Visit (HOSPITAL_BASED_OUTPATIENT_CLINIC_OR_DEPARTMENT_OTHER): Payer: Managed Care, Other (non HMO) | Admitting: Anesthesiology

## 2022-07-03 ENCOUNTER — Encounter (HOSPITAL_BASED_OUTPATIENT_CLINIC_OR_DEPARTMENT_OTHER): Payer: Self-pay | Admitting: Surgery

## 2022-07-03 ENCOUNTER — Ambulatory Visit (HOSPITAL_BASED_OUTPATIENT_CLINIC_OR_DEPARTMENT_OTHER)
Admission: RE | Admit: 2022-07-03 | Discharge: 2022-07-03 | Disposition: A | Discharge status: Home / Self Care | Payer: Managed Care, Other (non HMO) | Attending: Surgery | Admitting: Surgery

## 2022-07-03 ENCOUNTER — Encounter (HOSPITAL_BASED_OUTPATIENT_CLINIC_OR_DEPARTMENT_OTHER)
Admission: RE | Disposition: A | Discharge status: Home / Self Care | Payer: Self-pay | Source: Home / Self Care | Attending: Surgery

## 2022-07-03 ENCOUNTER — Other Ambulatory Visit: Payer: Self-pay

## 2022-07-03 ENCOUNTER — Ambulatory Visit
Admission: RE | Admit: 2022-07-03 | Discharge: 2022-07-03 | Disposition: A | Discharge status: Home / Self Care | Payer: Managed Care, Other (non HMO) | Source: Ambulatory Visit | Attending: Surgery | Admitting: Surgery

## 2022-07-03 DIAGNOSIS — Z6839 Body mass index (BMI) 39.0-39.9, adult: Secondary | ICD-10-CM | POA: Insufficient documentation

## 2022-07-03 DIAGNOSIS — I10 Essential (primary) hypertension: Secondary | ICD-10-CM | POA: Diagnosis not present

## 2022-07-03 DIAGNOSIS — N6092 Unspecified benign mammary dysplasia of left breast: Secondary | ICD-10-CM

## 2022-07-03 DIAGNOSIS — E119 Type 2 diabetes mellitus without complications: Secondary | ICD-10-CM | POA: Insufficient documentation

## 2022-07-03 DIAGNOSIS — K219 Gastro-esophageal reflux disease without esophagitis: Secondary | ICD-10-CM | POA: Insufficient documentation

## 2022-07-03 DIAGNOSIS — N62 Hypertrophy of breast: Secondary | ICD-10-CM | POA: Insufficient documentation

## 2022-07-03 DIAGNOSIS — I4510 Unspecified right bundle-branch block: Secondary | ICD-10-CM | POA: Insufficient documentation

## 2022-07-03 DIAGNOSIS — N6022 Fibroadenosis of left breast: Secondary | ICD-10-CM | POA: Insufficient documentation

## 2022-07-03 DIAGNOSIS — Z833 Family history of diabetes mellitus: Secondary | ICD-10-CM | POA: Insufficient documentation

## 2022-07-03 DIAGNOSIS — Z01818 Encounter for other preprocedural examination: Secondary | ICD-10-CM

## 2022-07-03 DIAGNOSIS — E1149 Type 2 diabetes mellitus with other diabetic neurological complication: Secondary | ICD-10-CM

## 2022-07-03 DIAGNOSIS — J45909 Unspecified asthma, uncomplicated: Secondary | ICD-10-CM | POA: Insufficient documentation

## 2022-07-03 HISTORY — PX: BREAST LUMPECTOMY WITH RADIOACTIVE SEED LOCALIZATION: SHX6424

## 2022-07-03 HISTORY — DX: Essential (primary) hypertension: I10

## 2022-07-03 HISTORY — DX: Type 2 diabetes mellitus without complications: E11.9

## 2022-07-03 LAB — GLUCOSE, CAPILLARY
Glucose-Capillary: 86 mg/dL (ref 70–99)
Glucose-Capillary: 94 mg/dL (ref 70–99)

## 2022-07-03 LAB — POCT PREGNANCY, URINE: Preg Test, Ur: NEGATIVE

## 2022-07-03 SURGERY — BREAST LUMPECTOMY WITH RADIOACTIVE SEED LOCALIZATION
Anesthesia: General | Site: Breast | Laterality: Left

## 2022-07-03 MED ORDER — LIDOCAINE 2% (20 MG/ML) 5 ML SYRINGE
INTRAMUSCULAR | Status: DC | PRN
  Administered 2022-07-03: 100 mg via INTRAVENOUS

## 2022-07-03 MED ORDER — LACTATED RINGERS IV SOLN: INTRAVENOUS | Status: DC

## 2022-07-03 MED ORDER — OXYCODONE HCL 5 MG PO TABS: 5.0000 mg | ORAL_TABLET | Freq: Once | ORAL | Status: DC | PRN

## 2022-07-03 MED ORDER — BUPIVACAINE-EPINEPHRINE (PF) 0.25% -1:200000 IJ SOLN
INTRAMUSCULAR | Status: DC | PRN
  Administered 2022-07-03: 10 mL

## 2022-07-03 MED ORDER — PROPOFOL 10 MG/ML IV BOLUS
INTRAVENOUS | Status: DC | PRN
  Administered 2022-07-03: 200 mg via INTRAVENOUS

## 2022-07-03 MED ORDER — ONDANSETRON HCL 4 MG/2ML IJ SOLN
INTRAMUSCULAR | Status: DC | PRN
  Administered 2022-07-03: 4 mg via INTRAVENOUS

## 2022-07-03 MED ORDER — CEFAZOLIN IN SODIUM CHLORIDE 3-0.9 GM/100ML-% IV SOLN
3.0000 g | INTRAVENOUS | Status: AC
  Administered 2022-07-03: 3 g via INTRAVENOUS

## 2022-07-03 MED ORDER — MIDAZOLAM HCL 5 MG/5ML IJ SOLN
INTRAMUSCULAR | Status: DC | PRN
  Administered 2022-07-03: 2 mg via INTRAVENOUS

## 2022-07-03 MED ORDER — FENTANYL CITRATE (PF) 100 MCG/2ML IJ SOLN
INTRAMUSCULAR | Status: AC
  Filled 2022-07-03: qty 2

## 2022-07-03 MED ORDER — CHLORHEXIDINE GLUCONATE CLOTH 2 % EX PADS: 6.0000 | MEDICATED_PAD | Freq: Once | CUTANEOUS | Status: DC

## 2022-07-03 MED ORDER — MIDAZOLAM HCL 2 MG/2ML IJ SOLN
INTRAMUSCULAR | Status: AC
  Filled 2022-07-03: qty 2

## 2022-07-03 MED ORDER — ACETAMINOPHEN 500 MG PO TABS
1000.0000 mg | ORAL_TABLET | Freq: Once | ORAL | Status: AC
  Administered 2022-07-03: 1000 mg via ORAL

## 2022-07-03 MED ORDER — OXYCODONE HCL 5 MG/5ML PO SOLN: 5.0000 mg | Freq: Once | ORAL | Status: DC | PRN

## 2022-07-03 MED ORDER — ACETAMINOPHEN 500 MG PO TABS
ORAL_TABLET | ORAL | Status: AC
  Filled 2022-07-03: qty 2

## 2022-07-03 MED ORDER — AMISULPRIDE (ANTIEMETIC) 5 MG/2ML IV SOLN: 10.0000 mg | Freq: Once | INTRAVENOUS | Status: DC | PRN

## 2022-07-03 MED ORDER — CEFAZOLIN IN SODIUM CHLORIDE 3-0.9 GM/100ML-% IV SOLN
INTRAVENOUS | Status: AC
  Filled 2022-07-03: qty 100

## 2022-07-03 MED ORDER — DEXAMETHASONE SODIUM PHOSPHATE 4 MG/ML IJ SOLN
INTRAMUSCULAR | Status: DC | PRN
  Administered 2022-07-03: 10 mg via INTRAVENOUS

## 2022-07-03 MED ORDER — FENTANYL CITRATE (PF) 100 MCG/2ML IJ SOLN
INTRAMUSCULAR | Status: DC | PRN
  Administered 2022-07-03 (×2): 50 ug via INTRAVENOUS

## 2022-07-03 MED ORDER — TRAMADOL HCL 50 MG PO TABS: 50.0000 mg | ORAL_TABLET | Freq: Four times a day (QID) | ORAL | 0 refills | Status: AC | PRN

## 2022-07-03 MED ORDER — FENTANYL CITRATE (PF) 100 MCG/2ML IJ SOLN: 25.0000 ug | INTRAMUSCULAR | Status: DC | PRN

## 2022-07-03 SURGICAL SUPPLY — 47 items
ADH SKN CLS APL DERMABOND .7 (GAUZE/BANDAGES/DRESSINGS) ×1
APL PRP STRL LF DISP 70% ISPRP (MISCELLANEOUS) ×1
APPLIER CLIP 9.375 MED OPEN (MISCELLANEOUS)
APR CLP MED 9.3 20 MLT OPN (MISCELLANEOUS)
BINDER BREAST 3XL (GAUZE/BANDAGES/DRESSINGS) IMPLANT
BINDER BREAST LRG (GAUZE/BANDAGES/DRESSINGS) IMPLANT
BINDER BREAST MEDIUM (GAUZE/BANDAGES/DRESSINGS) IMPLANT
BINDER BREAST XLRG (GAUZE/BANDAGES/DRESSINGS) IMPLANT
BINDER BREAST XXLRG (GAUZE/BANDAGES/DRESSINGS) IMPLANT
BLADE SURG 15 STRL LF DISP TIS (BLADE) ×1 IMPLANT
BLADE SURG 15 STRL SS (BLADE) ×1
CANISTER SUC SOCK COL 7IN (MISCELLANEOUS) IMPLANT
CANISTER SUCT 1200ML W/VALVE (MISCELLANEOUS) IMPLANT
CHLORAPREP W/TINT 26 (MISCELLANEOUS) ×1 IMPLANT
CLIP APPLIE 9.375 MED OPEN (MISCELLANEOUS) IMPLANT
COVER BACK TABLE 60X90IN (DRAPES) ×1 IMPLANT
COVER MAYO STAND STRL (DRAPES) ×1 IMPLANT
COVER PROBE CYLINDRICAL 5X96 (MISCELLANEOUS) ×1 IMPLANT
DERMABOND ADVANCED .7 DNX12 (GAUZE/BANDAGES/DRESSINGS) ×1 IMPLANT
DRAPE LAPAROSCOPIC ABDOMINAL (DRAPES) ×1 IMPLANT
DRAPE UTILITY XL STRL (DRAPES) ×1 IMPLANT
ELECT REM PT RETURN 9FT ADLT (ELECTROSURGICAL) ×1
ELECTRODE REM PT RTRN 9FT ADLT (ELECTROSURGICAL) ×1 IMPLANT
GAUZE SPONGE 4X4 12PLY STRL LF (GAUZE/BANDAGES/DRESSINGS) IMPLANT
GLOVE SURG SIGNA 7.5 PF LTX (GLOVE) ×1 IMPLANT
GOWN STRL REUS W/ TWL LRG LVL3 (GOWN DISPOSABLE) ×1 IMPLANT
GOWN STRL REUS W/ TWL XL LVL3 (GOWN DISPOSABLE) ×1 IMPLANT
GOWN STRL REUS W/TWL LRG LVL3 (GOWN DISPOSABLE) ×1
GOWN STRL REUS W/TWL XL LVL3 (GOWN DISPOSABLE) ×1
KIT MARKER MARGIN INK (KITS) ×1 IMPLANT
NDL HYPO 25X1 1.5 SAFETY (NEEDLE) ×1 IMPLANT
NEEDLE HYPO 25X1 1.5 SAFETY (NEEDLE) ×1 IMPLANT
NS IRRIG 1000ML POUR BTL (IV SOLUTION) IMPLANT
PACK BASIN DAY SURGERY FS (CUSTOM PROCEDURE TRAY) ×1 IMPLANT
PENCIL SMOKE EVACUATOR (MISCELLANEOUS) ×1 IMPLANT
SLEEVE SCD COMPRESS KNEE MED (STOCKING) ×1 IMPLANT
SPIKE FLUID TRANSFER (MISCELLANEOUS) IMPLANT
SPONGE T-LAP 4X18 ~~LOC~~+RFID (SPONGE) ×1 IMPLANT
SUT MNCRL AB 4-0 PS2 18 (SUTURE) ×1 IMPLANT
SUT SILK 2 0 SH (SUTURE) IMPLANT
SUT VIC AB 3-0 SH 27 (SUTURE) ×1
SUT VIC AB 3-0 SH 27X BRD (SUTURE) ×1 IMPLANT
SYR CONTROL 10ML LL (SYRINGE) ×1 IMPLANT
TOWEL GREEN STERILE FF (TOWEL DISPOSABLE) ×1 IMPLANT
TRAY FAXITRON CT DISP (TRAY / TRAY PROCEDURE) ×1 IMPLANT
TUBE CONNECTING 20X1/4 (TUBING) IMPLANT
YANKAUER SUCT BULB TIP NO VENT (SUCTIONS) IMPLANT

## 2022-07-03 NOTE — Transfer of Care (Signed)
Immediate Anesthesia Transfer of Care Note  Patient: Karen Mendoza  Procedure(s) Performed: LEFT BREAST LUMPECTOMY WITH RADIOACTIVE SEED LOCALIZATION (Left: Breast)  Patient Location: PACU  Anesthesia Type:General  Level of Consciousness: awake, alert , drowsy, and patient cooperative  Airway & Oxygen Therapy: Patient Spontanous Breathing and Patient connected to face mask oxygen  Post-op Assessment: Report given to RN and Post -op Vital signs reviewed and stable  Post vital signs: Reviewed and stable  Last Vitals:  Vitals Value Taken Time  BP    Temp    Pulse 68 07/03/22 0854  Resp    SpO2 94 % 07/03/22 0854  Vitals shown include unvalidated device data.  Last Pain:  Vitals:   07/03/22 0715  TempSrc:   PainSc: 0-No pain      Patients Stated Pain Goal: 4 (07/03/22 0715)  Complications: No notable events documented.

## 2022-07-03 NOTE — Op Note (Signed)
LEFT BREAST LUMPECTOMY WITH RADIOACTIVE SEED LOCALIZATION  Procedure Note  ELEONORE SHIPPEE 07/03/2022   Pre-op Diagnosis: LEFT BREAST ATYPICAL DUCTAL HYPERPLASIA     Post-op Diagnosis: same  Procedure(s): LEFT BREAST LUMPECTOMY WITH RADIOACTIVE SEED LOCALIZATION  Surgeon(s): Abigail Miyamoto, MD  Anesthesia: General  Staff:  Circulator: Raliegh Scarlet, RN Scrub Person: Maryan Rued, RN  Estimated Blood Loss: Minimal               Specimens: sent to path  Indications: This is a 52 year old female who was found to have abnormalities in both her breast on screening mammography.  She underwent 3 separate biopsies.  1 biopsy of the left breast showed atypical ductal hyperplasia.  Biopsies were benign.  The decision was made to proceed with a left radioactive seed guided breast lumpectomy to remove the area of atypical ductal hyperplasia complete histologic evaluation to rule out malignancy.  Procedure: The patient was brought to the operating room identified as correct patient.  She was placed supine on the operating table and general esthesia was induced.  Left breast was prepped and draped in usual sterile fashion.  Using the neoprobe I located the radioactive seed and superficial breast.  The medial edge of the nipple areolar complex.  I anesthetized the skin at the medial nipple-areolar complex with Marcaine.  I then made an incision with a scalpel.  I then dissected down to the breast tissue with electrocautery.  We easily identified the radioactive seed with the neoprobe and I performed a lumpectomy was then widely around the signal from the radioactive seed.  I then completed the lumpectomy staying the signal from the seed.  Once the specimen was removed I marked all margins with paint.  An x-ray was performed of the specimen confirming that the radioactive seed and previous biopsy clip in the specimen.  The specimen was then sent to pathology for evaluation.  We achieved  hemostasis with cautery.  I then closed the subcutaneous tissues with interrupted 3-0 Vicryl sutures and closed the skin with a running 4-0 Monocryl suture.  Dermabond was then applied.  The patient tolerated the procedure well.  All the counts were correct at the end of the procedure.  The patient was then extubated operating room and taken in stable condition to the recovery room.           Abigail Miyamoto   Date: 07/03/2022  Time: 8:50 AM

## 2022-07-03 NOTE — Anesthesia Procedure Notes (Signed)
Procedure Name: LMA Insertion Date/Time: 07/03/2022 8:25 AM  Performed by: Ronnette Hila, CRNAPre-anesthesia Checklist: Patient identified, Emergency Drugs available, Suction available and Patient being monitored Patient Re-evaluated:Patient Re-evaluated prior to induction Oxygen Delivery Method: Circle system utilized Preoxygenation: Pre-oxygenation with 100% oxygen Induction Type: IV induction Ventilation: Mask ventilation without difficulty LMA: LMA inserted LMA Size: 5.0 Number of attempts: 1 Airway Equipment and Method: Bite block Placement Confirmation: positive ETCO2 Tube secured with: Tape Dental Injury: Teeth and Oropharynx as per pre-operative assessment

## 2022-07-03 NOTE — Interval H&P Note (Signed)
History and Physical Interval Note:no change in H and P  07/03/2022 7:06 AM  Karen Mendoza  has presented today for surgery, with the diagnosis of LEFT BREAST ATYPICAL DUCTAL HYPERPLASIA.  The various methods of treatment have been discussed with the patient and family. After consideration of risks, benefits and other options for treatment, the patient has consented to  Procedure(s): LEFT BREAST LUMPECTOMY WITH RADIOACTIVE SEED LOCALIZATION (Left) as a surgical intervention.  The patient's history has been reviewed, patient examined, no change in status, stable for surgery.  I have reviewed the patient's chart and labs.  Questions were answered to the patient's satisfaction.     Abigail Miyamoto

## 2022-07-03 NOTE — Anesthesia Postprocedure Evaluation (Signed)
Anesthesia Post Note  Patient: Karen Mendoza  Procedure(s) Performed: LEFT BREAST LUMPECTOMY WITH RADIOACTIVE SEED LOCALIZATION (Left: Breast)     Patient location during evaluation: PACU Anesthesia Type: General Level of consciousness: awake Pain management: pain level controlled Vital Signs Assessment: post-procedure vital signs reviewed and stable Respiratory status: spontaneous breathing, nonlabored ventilation and respiratory function stable Cardiovascular status: blood pressure returned to baseline and stable Postop Assessment: no apparent nausea or vomiting Anesthetic complications: no   No notable events documented.  Last Vitals:  Vitals:   07/03/22 0855 07/03/22 0915  BP: 113/67 117/69  Pulse: 68 66  Resp: 11 14  Temp: (!) 36.2 C   SpO2: 94% 98%    Last Pain:  Vitals:   07/03/22 0915  TempSrc:   PainSc: 0-No pain                 Linton Rump

## 2022-07-04 ENCOUNTER — Encounter (HOSPITAL_BASED_OUTPATIENT_CLINIC_OR_DEPARTMENT_OTHER): Payer: Self-pay | Admitting: Surgery

## 2022-07-07 LAB — SURGICAL PATHOLOGY

## 2023-09-04 ENCOUNTER — Other Ambulatory Visit: Payer: Self-pay | Admitting: Orthopaedic Surgery

## 2023-09-04 DIAGNOSIS — M79672 Pain in left foot: Secondary | ICD-10-CM

## 2023-09-11 ENCOUNTER — Ambulatory Visit
Admission: RE | Admit: 2023-09-11 | Discharge: 2023-09-11 | Disposition: A | Discharge status: Home / Self Care | Source: Ambulatory Visit | Attending: Orthopaedic Surgery | Admitting: Orthopaedic Surgery

## 2023-09-11 DIAGNOSIS — M79672 Pain in left foot: Secondary | ICD-10-CM

## 2023-09-25 ENCOUNTER — Other Ambulatory Visit: Payer: Self-pay | Admitting: Family Medicine

## 2023-09-25 DIAGNOSIS — N950 Postmenopausal bleeding: Secondary | ICD-10-CM

## 2023-10-12 ENCOUNTER — Ambulatory Visit
Admission: RE | Admit: 2023-10-12 | Discharge: 2023-10-12 | Disposition: A | Discharge status: Home / Self Care | Source: Ambulatory Visit | Attending: Family Medicine | Admitting: Family Medicine

## 2023-10-12 DIAGNOSIS — N950 Postmenopausal bleeding: Secondary | ICD-10-CM

## 2023-11-24 ENCOUNTER — Other Ambulatory Visit: Payer: Self-pay | Admitting: Gastroenterology

## 2023-11-30 ENCOUNTER — Encounter (HOSPITAL_COMMUNITY): Payer: Self-pay | Admitting: Gastroenterology

## 2023-12-08 ENCOUNTER — Ambulatory Visit (HOSPITAL_COMMUNITY): Payer: Self-pay | Admitting: Anesthesiology

## 2023-12-08 ENCOUNTER — Encounter (HOSPITAL_COMMUNITY): Payer: Self-pay | Admitting: Gastroenterology

## 2023-12-08 ENCOUNTER — Encounter (HOSPITAL_COMMUNITY): Payer: Self-pay | Admitting: Anesthesiology

## 2023-12-08 ENCOUNTER — Ambulatory Visit (HOSPITAL_COMMUNITY)
Admission: RE | Admit: 2023-12-08 | Discharge: 2023-12-08 | Disposition: A | Discharge status: Home / Self Care | Attending: Gastroenterology | Admitting: Gastroenterology

## 2023-12-08 ENCOUNTER — Other Ambulatory Visit: Payer: Self-pay

## 2023-12-08 ENCOUNTER — Encounter (HOSPITAL_COMMUNITY)
Admission: RE | Disposition: A | Discharge status: Home / Self Care | Payer: Self-pay | Source: Home / Self Care | Attending: Gastroenterology

## 2023-12-08 DIAGNOSIS — Z1211 Encounter for screening for malignant neoplasm of colon: Secondary | ICD-10-CM

## 2023-12-08 DIAGNOSIS — K648 Other hemorrhoids: Secondary | ICD-10-CM

## 2023-12-08 DIAGNOSIS — I10 Essential (primary) hypertension: Secondary | ICD-10-CM | POA: Diagnosis not present

## 2023-12-08 DIAGNOSIS — E119 Type 2 diabetes mellitus without complications: Secondary | ICD-10-CM

## 2023-12-08 HISTORY — PX: COLONOSCOPY: SHX5424

## 2023-12-08 LAB — GLUCOSE, CAPILLARY: Glucose-Capillary: 92 mg/dL (ref 70–99)

## 2023-12-08 SURGERY — COLONOSCOPY
Anesthesia: Monitor Anesthesia Care

## 2023-12-08 MED ORDER — PROPOFOL 500 MG/50ML IV EMUL
INTRAVENOUS | Status: AC
  Filled 2023-12-08: qty 50

## 2023-12-08 MED ORDER — PROPOFOL 10 MG/ML IV BOLUS
INTRAVENOUS | Status: AC
  Filled 2023-12-08: qty 20

## 2023-12-08 MED ORDER — PROPOFOL 500 MG/50ML IV EMUL
INTRAVENOUS | Status: DC | PRN
  Administered 2023-12-08: 150 ug/kg/min via INTRAVENOUS
  Administered 2023-12-08: 100 mg via INTRAVENOUS

## 2023-12-08 MED ORDER — SODIUM CHLORIDE 0.9 % IV SOLN: INTRAVENOUS | Status: DC

## 2023-12-08 NOTE — Discharge Instructions (Signed)

## 2023-12-08 NOTE — Anesthesia Postprocedure Evaluation (Signed)
 Anesthesia Post Note  Patient: Karen Mendoza  Procedure(s) Performed: COLONOSCOPY     Patient location during evaluation: PACU Anesthesia Type: MAC Level of consciousness: awake and alert Pain management: pain level controlled Vital Signs Assessment: post-procedure vital signs reviewed and stable Respiratory status: spontaneous breathing, nonlabored ventilation, respiratory function stable and patient connected to nasal cannula oxygen Cardiovascular status: stable and blood pressure returned to baseline Postop Assessment: no apparent nausea or vomiting Anesthetic complications: no   No notable events documented.  Last Vitals:  Vitals:   12/08/23 1040 12/08/23 1052  BP: 138/65 (!) 143/76  Pulse: 64 64  Resp: 19 18  Temp:    SpO2: 98% 100%    Last Pain:  Vitals:   12/08/23 1052  TempSrc:   PainSc: 0-No pain                 Gayna Braddy

## 2023-12-08 NOTE — Interval H&P Note (Signed)
 History and Physical Interval Note:  12/08/2023 9:57 AM  Karen Mendoza  has presented today for surgery, with the diagnosis of Screening/Family histroy of colon cancer.  The various methods of treatment have been discussed with the patient and family. After consideration of risks, benefits and other options for treatment, the patient has consented to  Procedure(s): COLONOSCOPY (N/A) as a surgical intervention.  The patient's history has been reviewed, patient examined, no change in status, stable for surgery.  I have reviewed the patient's chart and labs.  Questions were answered to the patient's satisfaction.     Jerrell JAYSON Sol

## 2023-12-08 NOTE — Op Note (Signed)
 [facility] Patient Name: Karen Mendoza Procedure Date: 12/08/2023 MRN: [id number] Attending MD: Jerrell JAYSON Sol , MD, [id number] Date of Birth: 1970/09/06 CSN: [id number] Age: 53 Admit Type: Outpatient Procedure:                Colonoscopy Indications:              Screening in patient at increased risk: Colorectal                            cancer in father before age 58, Last colonoscopy:                            September 2020 Providers:                Jerrell KYM Sol, MD, Ozell Pouch, Farris Southgate, Technician Referring MD:             Montie Pizza Medicines:                Propofol  per Anesthesia, Monitored Anesthesia Care Complications:            No immediate complications. Estimated Blood Loss:     Estimated blood loss: none. Procedure:                Pre-Anesthesia Assessment:                           - Prior to the procedure, a History and Physical                            was performed, and patient medications and                            allergies were reviewed. The patient's tolerance of                            previous anesthesia was also reviewed. The risks                            and benefits of the procedure and the sedation                            options and risks were discussed with the patient.                            All questions were answered, and informed consent                            was obtained. Prior Anticoagulants: The patient has                            taken no anticoagulant or antiplatelet agents. ASA  Grade Assessment: III - A patient with severe                            systemic disease. After reviewing the risks and                            benefits, the patient was deemed in satisfactory                            condition to undergo the procedure.                           After obtaining informed consent, the colonoscope                             was passed under direct vision. Throughout the                            procedure, the patient's blood pressure, pulse, and                            oxygen saturations were monitored continuously. The                            [id number]-[id number] ([id number]) colonoscope was introduced                            through the anus and advanced to the the cecum,                            identified by appendiceal orifice and ileocecal                            valve. The colonoscopy was somewhat difficult due                            to a tortuous colon and fair prep. Successful                            completion of the procedure was aided by                            straightening and shortening the scope to obtain                            bowel loop reduction and lavage. The patient                            tolerated the procedure well. The quality of the                            bowel preparation was fair and fair but repeated  irrigation led to a good and adequate prep. The                            ileocecal valve, appendiceal orifice, and rectum                            were photographed. Scope In: 10:08:02 AM Scope Out: 10:26:24 AM Scope Withdrawal Time: 0 hours 14 minutes 44 seconds  Total Procedure Duration: 0 hours 18 minutes 22 seconds  Findings:      The perianal and digital rectal examinations were normal.      Internal hemorrhoids were found during retroflexion. The hemorrhoids       were medium-sized and Grade I (internal hemorrhoids that do not       prolapse).      The exam was otherwise normal throughout the examined colon. Impression:               - Preparation of the colon was fair.                           - Internal hemorrhoids.                           - No specimens collected. Moderate Sedation:      N/A - MAC procedure Recommendation:           - Patient has a contact number available for                             emergencies. The signs and symptoms of potential                            delayed complications were discussed with the                            patient. Return to normal activities tomorrow.                            Written discharge instructions were provided to the                            patient.                           - High fiber diet.                           - Repeat colonoscopy in 5 years for screening                            purposes. Procedure Code(s):        --- Professional ---                           [phone], Colonoscopy, flexible; diagnostic, including                            collection  of specimen(s) by brushing or washing,                            when performed (separate procedure) Diagnosis Code(s):        --- Professional ---                           Z80.0, Family history of malignant neoplasm of                            digestive organs                           K64.0, First degree hemorrhoids CPT copyright 2022 [employer]. All rights reserved. The codes documented in this report are preliminary and upon coder review may  be revised to meet current compliance requirements. Jerrell JAYSON Sol, MD 12/08/2023 10:35:16 AM This report has been signed electronically. Number of Addenda: 0

## 2023-12-08 NOTE — Anesthesia Preprocedure Evaluation (Addendum)
 Anesthesia Evaluation  Patient identified by MRN, date of birth, ID band Patient awake    Reviewed: Allergy & Precautions, NPO status , Patient's Chart, lab work & pertinent test results  History of Anesthesia Complications Negative for: history of anesthetic complications  Airway Mallampati: III  TM Distance: >3 FB Neck ROM: Full   Comment: Previous grade I view with MAC 4 Dental  (+) Dental Advisory Given   Pulmonary neg shortness of breath, asthma , neg sleep apnea, neg COPD, neg recent URI   Pulmonary exam normal breath sounds clear to auscultation       Cardiovascular hypertension, Pt. on medications (-) angina (-) Past MI, (-) Cardiac Stents and (-) CABG + dysrhythmias (incomplete RBBB)  Rhythm:Regular Rate:Normal     Neuro/Psych neg Seizures  Neuromuscular disease    GI/Hepatic Neg liver ROS,GERD  Medicated,,  Endo/Other  diabetes, Type 2  Class 3 obesity  Renal/GU negative Renal ROS     Musculoskeletal   Abdominal  (+) + obese  Peds  Hematology negative hematology ROS (+)   Anesthesia Other Findings BELLS PALSY w Left facial droop   Reproductive/Obstetrics                              Anesthesia Physical Anesthesia Plan  ASA: 3  Anesthesia Plan: MAC   Post-op Pain Management: Minimal or no pain anticipated   Induction: Intravenous  PONV Risk Score and Plan: 3 and Ondansetron , Dexamethasone  and Treatment may vary due to age or medical condition  Airway Management Planned: Mask, Natural Airway and Simple Face Mask  Additional Equipment: None  Intra-op Plan:   Post-operative Plan:   Informed Consent: I have reviewed the patients History and Physical, chart, labs and discussed the procedure including the risks, benefits and alternatives for the proposed anesthesia with the patient or authorized representative who has indicated his/her understanding and acceptance.      Dental advisory given  Plan Discussed with: Anesthesiologist and CRNA  Anesthesia Plan Comments: ( )        Anesthesia Quick Evaluation

## 2023-12-08 NOTE — Anesthesia Procedure Notes (Signed)
 Procedure Name: MAC Date/Time: 12/08/2023 10:06 AM  Performed by: Nada Corean CROME, CRNAPre-anesthesia Checklist: Emergency Drugs available, Patient identified, Suction available, Patient being monitored and Timeout performed Patient Re-evaluated:Patient Re-evaluated prior to induction Oxygen Delivery Method: Simple face mask Preoxygenation: Pre-oxygenation with 100% oxygen Induction Type: IV induction Placement Confirmation: positive ETCO2 Dental Injury: Teeth and Oropharynx as per pre-operative assessment

## 2023-12-08 NOTE — H&P (Signed)
 Date of Initial H&P: 12/02/23  History reviewed, patient examined, no change in status, stable for surgery.

## 2023-12-08 NOTE — Transfer of Care (Signed)
 Immediate Anesthesia Transfer of Care Note  Patient: Karen Mendoza  Procedure(s) Performed: COLONOSCOPY  Patient Location: PACU and Endoscopy Unit  Anesthesia Type:MAC  Level of Consciousness: awake, alert , oriented, and patient cooperative  Airway & Oxygen Therapy: Patient Spontanous Breathing  Post-op Assessment: Report given to RN and Post -op Vital signs reviewed and stable  Post vital signs: Reviewed and stable  Last Vitals:  Vitals Value Taken Time  BP 147/52 12/08/23 10:32  Temp 36.4 C 12/08/23 10:32  Pulse 64 12/08/23 10:33  Resp 17 12/08/23 10:33  SpO2 99 % 12/08/23 10:33  Vitals shown include unfiled device data.  Last Pain:  Vitals:   12/08/23 1032  TempSrc: Temporal  PainSc: 0-No pain         Complications: No notable events documented.

## 2023-12-09 ENCOUNTER — Encounter (HOSPITAL_COMMUNITY): Payer: Self-pay | Admitting: Gastroenterology

## 2024-01-04 ENCOUNTER — Other Ambulatory Visit (HOSPITAL_BASED_OUTPATIENT_CLINIC_OR_DEPARTMENT_OTHER): Payer: Self-pay | Admitting: Nurse Practitioner

## 2024-01-04 DIAGNOSIS — N939 Abnormal uterine and vaginal bleeding, unspecified: Secondary | ICD-10-CM

## 2024-01-04 DIAGNOSIS — N950 Postmenopausal bleeding: Secondary | ICD-10-CM

## 2024-01-05 ENCOUNTER — Ambulatory Visit (HOSPITAL_BASED_OUTPATIENT_CLINIC_OR_DEPARTMENT_OTHER)
Admission: RE | Admit: 2024-01-05 | Discharge: 2024-01-05 | Disposition: A | Discharge status: Home / Self Care | Source: Ambulatory Visit | Attending: Nurse Practitioner | Admitting: Nurse Practitioner

## 2024-01-05 DIAGNOSIS — N939 Abnormal uterine and vaginal bleeding, unspecified: Secondary | ICD-10-CM | POA: Insufficient documentation

## 2024-01-05 DIAGNOSIS — N950 Postmenopausal bleeding: Secondary | ICD-10-CM | POA: Diagnosis present

## 2024-01-22 ENCOUNTER — Ambulatory Visit

## 2024-02-05 ENCOUNTER — Other Ambulatory Visit: Payer: Self-pay | Admitting: Family Medicine

## 2024-02-05 DIAGNOSIS — Z1231 Encounter for screening mammogram for malignant neoplasm of breast: Secondary | ICD-10-CM

## 2024-02-19 ENCOUNTER — Ambulatory Visit
